# Patient Record
Sex: Male | Born: 1975 | Race: Black or African American | Hispanic: No | Marital: Single | Smoking: Current every day smoker
Health system: Southern US, Community
[De-identification: ages and names within clinical notes are randomized; demographics above are authoritative.]

## PROBLEM LIST (undated history)

## (undated) DIAGNOSIS — M109 Gout, unspecified: Secondary | ICD-10-CM

---

## 1998-09-01 ENCOUNTER — Emergency Department (HOSPITAL_COMMUNITY): Admission: EM | Admit: 1998-09-01 | Discharge: 1998-09-01 | Payer: Self-pay | Admitting: Emergency Medicine

## 1999-02-24 ENCOUNTER — Emergency Department (HOSPITAL_COMMUNITY): Admission: EM | Admit: 1999-02-24 | Discharge: 1999-02-24 | Payer: Self-pay | Admitting: Emergency Medicine

## 1999-02-24 ENCOUNTER — Encounter: Payer: Self-pay | Admitting: *Deleted

## 1999-05-01 ENCOUNTER — Emergency Department (HOSPITAL_COMMUNITY): Admission: EM | Admit: 1999-05-01 | Discharge: 1999-05-01 | Payer: Self-pay | Admitting: Emergency Medicine

## 1999-05-08 ENCOUNTER — Encounter: Payer: Self-pay | Admitting: Emergency Medicine

## 1999-05-08 ENCOUNTER — Emergency Department (HOSPITAL_COMMUNITY): Admission: EM | Admit: 1999-05-08 | Discharge: 1999-05-08 | Payer: Self-pay | Admitting: Emergency Medicine

## 1999-09-11 ENCOUNTER — Emergency Department (HOSPITAL_COMMUNITY): Admission: EM | Admit: 1999-09-11 | Discharge: 1999-09-11 | Payer: Self-pay | Admitting: Emergency Medicine

## 2002-12-31 ENCOUNTER — Emergency Department (HOSPITAL_COMMUNITY): Admission: EM | Admit: 2002-12-31 | Discharge: 2002-12-31 | Payer: Self-pay | Admitting: Emergency Medicine

## 2002-12-31 ENCOUNTER — Encounter: Payer: Self-pay | Admitting: Emergency Medicine

## 2003-02-20 ENCOUNTER — Emergency Department (HOSPITAL_COMMUNITY): Admission: EM | Admit: 2003-02-20 | Discharge: 2003-02-20 | Payer: Self-pay | Admitting: Emergency Medicine

## 2003-10-04 ENCOUNTER — Emergency Department (HOSPITAL_COMMUNITY): Admission: EM | Admit: 2003-10-04 | Discharge: 2003-10-04 | Payer: Self-pay | Admitting: Emergency Medicine

## 2004-11-03 ENCOUNTER — Emergency Department (HOSPITAL_COMMUNITY): Admission: EM | Admit: 2004-11-03 | Discharge: 2004-11-03 | Payer: Self-pay | Admitting: Emergency Medicine

## 2006-05-05 ENCOUNTER — Emergency Department (HOSPITAL_COMMUNITY): Admission: EM | Admit: 2006-05-05 | Discharge: 2006-05-05 | Payer: Self-pay | Admitting: Emergency Medicine

## 2007-02-07 ENCOUNTER — Emergency Department (HOSPITAL_COMMUNITY): Admission: EM | Admit: 2007-02-07 | Discharge: 2007-02-07 | Payer: Self-pay | Admitting: Family Medicine

## 2008-04-17 ENCOUNTER — Emergency Department (HOSPITAL_COMMUNITY): Admission: EM | Admit: 2008-04-17 | Discharge: 2008-04-17 | Payer: Self-pay | Admitting: Family Medicine

## 2008-06-20 ENCOUNTER — Emergency Department (HOSPITAL_COMMUNITY): Admission: EM | Admit: 2008-06-20 | Discharge: 2008-06-20 | Payer: Self-pay | Admitting: Emergency Medicine

## 2009-10-31 IMAGING — CR DG ABDOMEN 1V
2 series · 2 of 2 positions shown · non-contrast
Comparison: None

CLINICAL DATA: Bilateral intermittent flank pain.  Back pain.

ABDOMEN - 2 VIEW

[view not recorded (1 of 2)]
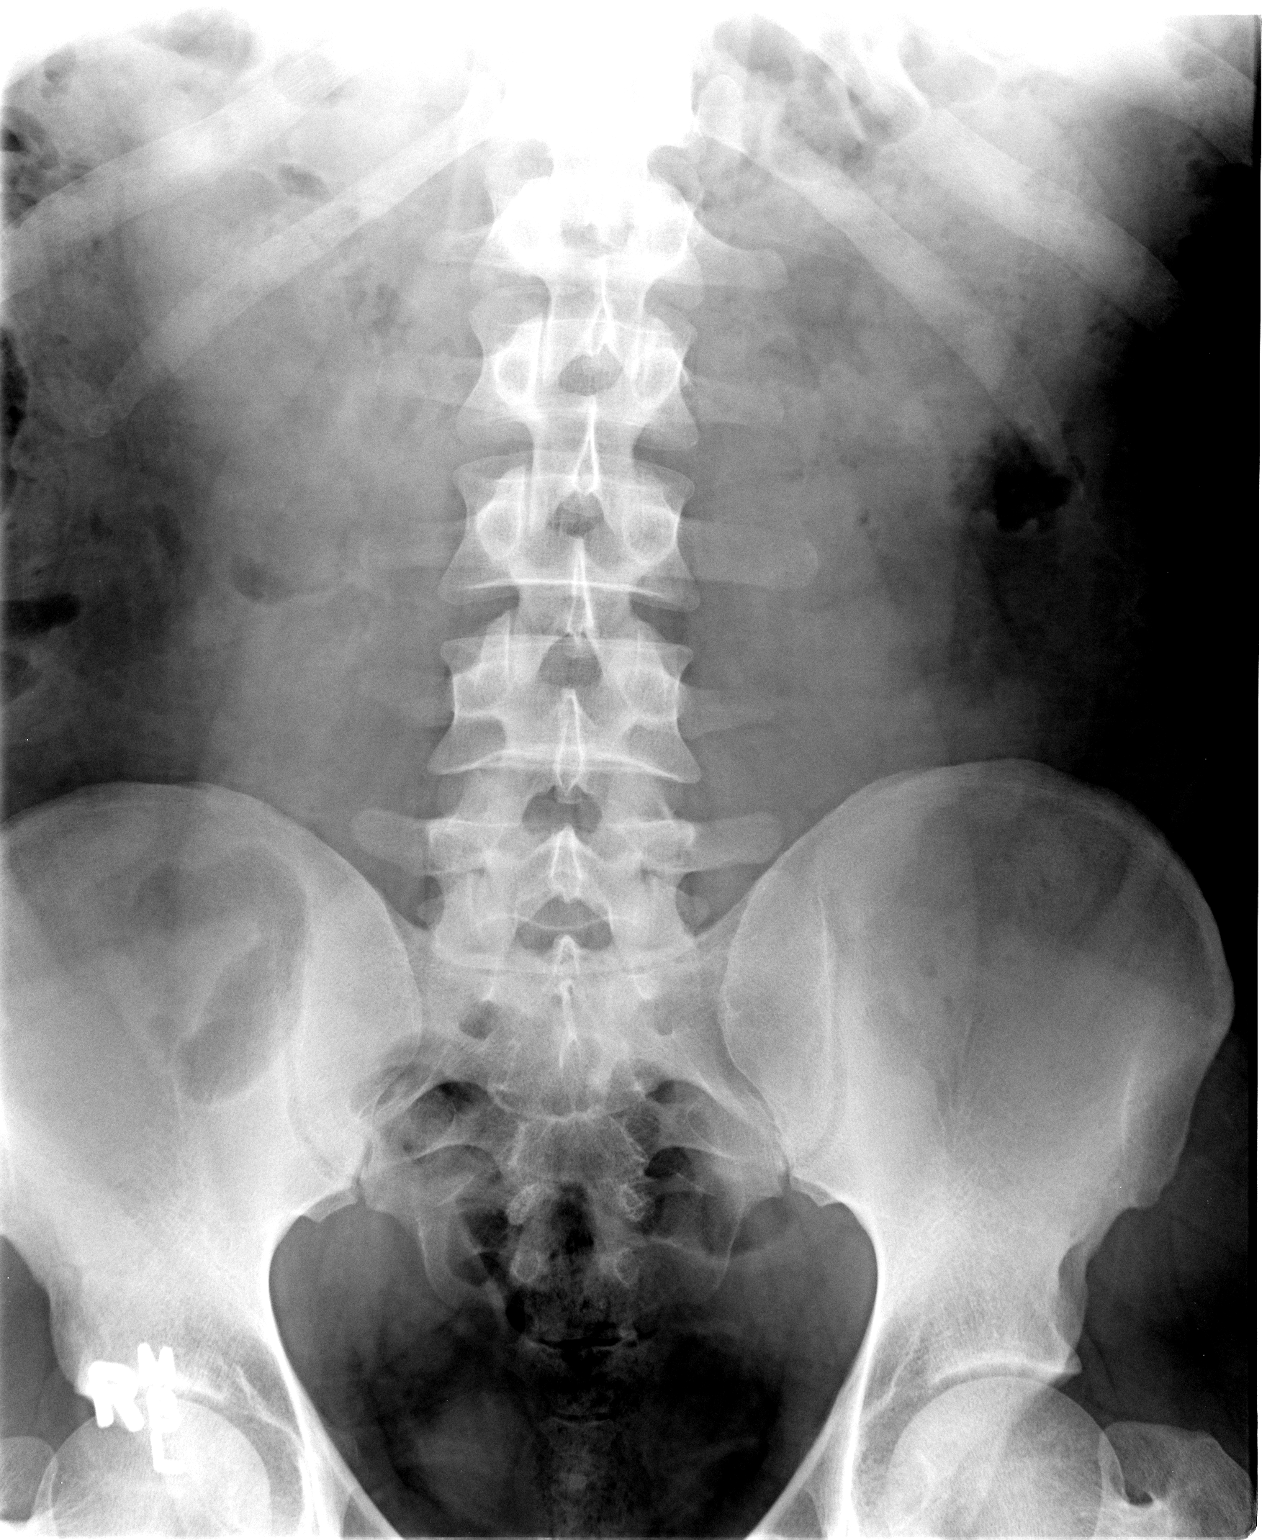

[view not recorded (2 of 2)]
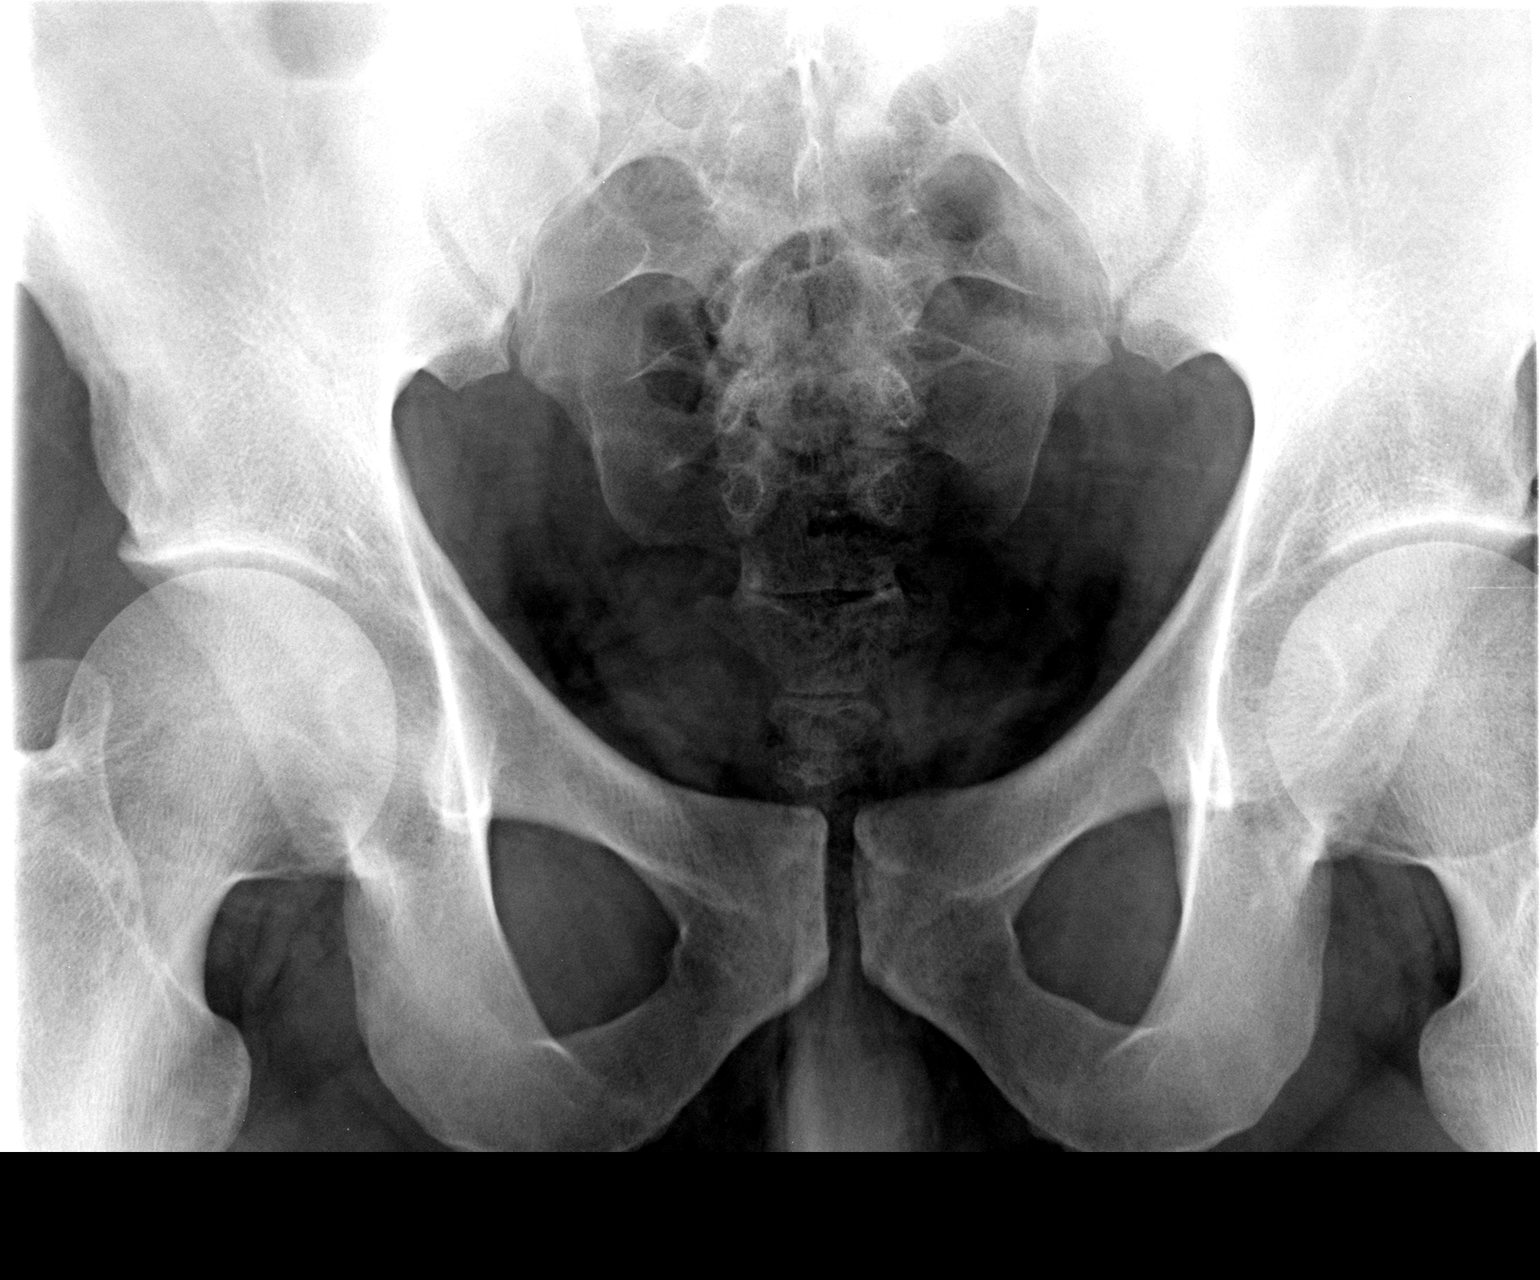

[2 of 2 positions shown; findings below may reference images not displayed]

FINDINGS: Normal bowel gas pattern.  No unusual calcifications.
Bony structures intact.  Psoas muscle margins are defined.
IMPRESSION: Negative abdomen for acute disease.

## 2011-05-18 LAB — POCT URINALYSIS DIP (DEVICE)
Glucose, UA: NEGATIVE mg/dL
Hgb urine dipstick: NEGATIVE
Nitrite: NEGATIVE
Operator id: 239701
Protein, ur: 30 mg/dL — AB
Specific Gravity, Urine: 1.025 (ref 1.005–1.030)
Urobilinogen, UA: 2 mg/dL — ABNORMAL HIGH (ref 0.0–1.0)
pH: 6 (ref 5.0–8.0)

## 2011-06-02 LAB — POCT RAPID STREP A: Streptococcus, Group A Screen (Direct): NEGATIVE

## 2015-12-06 ENCOUNTER — Encounter (HOSPITAL_COMMUNITY): Payer: Self-pay | Admitting: *Deleted

## 2015-12-06 ENCOUNTER — Emergency Department (HOSPITAL_COMMUNITY)
Admission: EM | Admit: 2015-12-06 | Discharge: 2015-12-06 | Disposition: A | Discharge status: Home / Self Care | Payer: Self-pay | Attending: Emergency Medicine | Admitting: Emergency Medicine

## 2015-12-06 DIAGNOSIS — M545 Low back pain, unspecified: Secondary | ICD-10-CM

## 2015-12-06 DIAGNOSIS — T148XXA Other injury of unspecified body region, initial encounter: Secondary | ICD-10-CM

## 2015-12-06 DIAGNOSIS — F17210 Nicotine dependence, cigarettes, uncomplicated: Secondary | ICD-10-CM | POA: Insufficient documentation

## 2015-12-06 DIAGNOSIS — Y9289 Other specified places as the place of occurrence of the external cause: Secondary | ICD-10-CM | POA: Insufficient documentation

## 2015-12-06 DIAGNOSIS — Y9389 Activity, other specified: Secondary | ICD-10-CM | POA: Insufficient documentation

## 2015-12-06 DIAGNOSIS — X509XXA Other and unspecified overexertion or strenuous movements or postures, initial encounter: Secondary | ICD-10-CM | POA: Insufficient documentation

## 2015-12-06 DIAGNOSIS — S29012A Strain of muscle and tendon of back wall of thorax, initial encounter: Secondary | ICD-10-CM | POA: Insufficient documentation

## 2015-12-06 DIAGNOSIS — Y990 Civilian activity done for income or pay: Secondary | ICD-10-CM | POA: Insufficient documentation

## 2015-12-06 MED ORDER — METHOCARBAMOL 500 MG PO TABS: 500.0000 mg | ORAL_TABLET | Freq: Two times a day (BID) | ORAL | Status: DC | PRN

## 2015-12-06 MED ORDER — IBUPROFEN 800 MG PO TABS: 800.0000 mg | ORAL_TABLET | Freq: Three times a day (TID) | ORAL | Status: DC

## 2015-12-06 NOTE — ED Notes (Signed)
PT reports muscle pain to Rt upper body and mid back.

## 2015-12-06 NOTE — ED Notes (Signed)
Declined W/C at D/C and was escorted to lobby by RN. 

## 2015-12-06 NOTE — ED Provider Notes (Signed)
CSN: [id number]     Arrival date & time 12/06/15  1003 History  By signing my name below, I, Placido SouLogan Joldersma, attest that this documentation has been prepared under the direction and in the presence of [facility] Pilcher Hays Dunnigan, PA-C. Electronically Signed: Placido SouLogan Joldersma, ED Scribe. 12/06/2015. 10:22 AM.   Chief Complaint  Patient presents with  . Muscle Pain   The history is provided by the patient. No language interpreter was used.   HPI Comments: Charles Price is a 40 y.o. male who presents to the Emergency Department complaining of waxing and waning, 5/10, right upper and bilateral mid-back pain x 1 week. 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 Pt denies taking any medications for pain management but has been resting after work with little relief. He denies neck pain, numbness, tingling, fever, b/b incontinence or other associated symptoms at this time.   History reviewed. No pertinent past medical history. History reviewed. No pertinent past surgical history. History reviewed. No pertinent family history. Social History  Substance Use Topics  . Smoking status: Current Every Day Smoker    Types: Cigarettes  . Smokeless tobacco: Never Used  . Alcohol Use: Yes     Comment: social    Review of Systems  Constitutional: Negative for fever.  Musculoskeletal: Positive for myalgias and back pain. Negative for neck pain.  Skin: Negative for wound.  Neurological: Negative for numbness.    Allergies  Review of patient's allergies indicates no known allergies.  Home Medications   Prior to Admission medications   Medication Sig Start Date End Date Taking? Authorizing Provider  ibuprofen (ADVIL,MOTRIN) 800 MG tablet Take 1 tablet (800 mg total) by mouth 3 (three) times daily. 12/06/15   Chase PicketJaime Pilcher Talecia Sherlin, PA-C  methocarbamol (ROBAXIN) 500 MG tablet Take 1 tablet (500 mg total) by mouth 2 (two) times daily as needed  for muscle spasms. 12/06/15   Chyenne Sobczak Pilcher Lyllian Gause, PA-C   BP 131/91 mmHg  Pulse 75  Temp(Src) 98.5 F (36.9 C) (Oral)  Resp 18  Ht 6\' 2"  (1.88 m)  Wt 103.42 kg  BMI 29.26 kg/m2  SpO2 99%    Physical Exam  Constitutional: He is oriented to person, place, and time. He appears well-developed and well-nourished.  NAD  HENT:  Head: Normocephalic and atraumatic.  Eyes: EOM are normal.  Neck: Normal range of motion.  Full ROM without pain No midline tenderness No tenderness of paraspinal musculature  Cardiovascular: Normal rate, regular rhythm and normal heart sounds.  Exam reveals no gallop and no friction rub.   No murmur heard. Pulmonary/Chest: Effort normal and breath sounds normal. No respiratory distress. He has no wheezes. He has no rales.  Abdominal: Soft. Bowel sounds are normal. He exhibits no distension. There is no tenderness.  Musculoskeletal: Normal range of motion.  Gait is not antalgic; patient is able to ambulate without difficulty. No noted deformities or signs of inflammation. No overlying skin changes. No midline tenderness; mild tenderness to palpation of lumbar paraspinal musculature. Full ROM. Straight leg raises are negative bilaterally for radicular symptoms. 5/5 muscle strength of bilateral LE's. Right shoulder with full ROM. Negative Neer's, negative Hawkins.   Neurological: He is alert and oriented to person, place, and time. He has normal reflexes.  All four extremities neurovascularly intact.  Skin: Skin is warm and dry. No rash noted. No erythema.  Psychiatric: He has a normal mood and affect.  Nursing note  and vitals reviewed.   ED Course  Procedures  COORDINATION OF CARE: 10:20 AM Discussed next steps with pt. He verbalized understanding and is agreeable with the plan.   Labs Review Labs Reviewed - No data to display  Imaging Review No results found.   EKG Interpretation None      MDM   Final diagnoses:  Muscle strain  Bilateral low back  pain without sciatica   Charles Price presents to ED for bilateral low back pain and right sided upper back pain. Right upper back pain is in the rhomboid musculature, nontender to palpation, worse with movement. LBP bilateral, tender to palpation of paraspinal musculature. No neurological deficits and normal neuro exam. Patient is ambulatory. No loss of bowel or bladder control. No concern for cauda equina. No fever, night sweats, weight loss, h/o cancer, IVDA, no recent procedure to back. No urinary symptoms suggestive of UTI. Supportive care and return precaution discussed. Rx for ibuprofen, robaxin. Appears safe for discharge at this time. Follow up as indicated in discharge paperwork.   I personally performed the services described in this documentation, which was scribed in my presence. The recorded information has been reviewed and is accurate.  [facility] Elnoria Livingston, PA-C 12/06/15 1203  Cathren Laine, MD 12/07/15 [phone]

## 2015-12-06 NOTE — Discharge Instructions (Signed)
Take anti-inflammatory (ibuprofen) 3 times daily for the first 2 days, then only as needed for pain. Use Robaxin (your muscle relaxer) only as needed, best at bedtime - This can make you very drowsy - please do not drink or drive on this medication. If symptoms do not improve over the next week, please follow-up with the orthopedic clinic listed or your primary provider. Return to the ER for any new or worsening symptoms, any additional concerns.

## 2016-02-27 ENCOUNTER — Emergency Department (HOSPITAL_COMMUNITY)
Admission: EM | Admit: 2016-02-27 | Discharge: 2016-02-27 | Disposition: A | Discharge status: Home / Self Care | Payer: Self-pay | Attending: Emergency Medicine | Admitting: Emergency Medicine

## 2016-02-27 ENCOUNTER — Encounter (HOSPITAL_COMMUNITY): Payer: Self-pay | Admitting: Emergency Medicine

## 2016-02-27 DIAGNOSIS — F17210 Nicotine dependence, cigarettes, uncomplicated: Secondary | ICD-10-CM | POA: Insufficient documentation

## 2016-02-27 DIAGNOSIS — Y929 Unspecified place or not applicable: Secondary | ICD-10-CM | POA: Insufficient documentation

## 2016-02-27 DIAGNOSIS — Z79899 Other long term (current) drug therapy: Secondary | ICD-10-CM | POA: Insufficient documentation

## 2016-02-27 DIAGNOSIS — Y9389 Activity, other specified: Secondary | ICD-10-CM | POA: Insufficient documentation

## 2016-02-27 DIAGNOSIS — S29012A Strain of muscle and tendon of back wall of thorax, initial encounter: Secondary | ICD-10-CM | POA: Insufficient documentation

## 2016-02-27 DIAGNOSIS — Y999 Unspecified external cause status: Secondary | ICD-10-CM | POA: Insufficient documentation

## 2016-02-27 DIAGNOSIS — X500XXA Overexertion from strenuous movement or load, initial encounter: Secondary | ICD-10-CM | POA: Insufficient documentation

## 2016-02-27 DIAGNOSIS — T148XXA Other injury of unspecified body region, initial encounter: Secondary | ICD-10-CM

## 2016-02-27 MED ORDER — METHOCARBAMOL 500 MG PO TABS: 500.0000 mg | ORAL_TABLET | Freq: Every evening | ORAL | Status: DC | PRN

## 2016-02-27 MED ORDER — LIDOCAINE 5 % EX PTCH
1.0000 | MEDICATED_PATCH | Freq: Once | CUTANEOUS | Status: DC
  Administered 2016-02-27: 1 via TRANSDERMAL
  Filled 2016-02-27: qty 1

## 2016-02-27 MED ORDER — IBUPROFEN 400 MG PO TABS
800.0000 mg | ORAL_TABLET | Freq: Once | ORAL | Status: AC
  Administered 2016-02-27: 800 mg via ORAL
  Filled 2016-02-27: qty 2

## 2016-02-27 MED ORDER — IBUPROFEN 800 MG PO TABS
800.0000 mg | ORAL_TABLET | Freq: Three times a day (TID) | ORAL | Status: DC
Start: 2016-02-27 — End: 2019-01-21

## 2016-02-27 NOTE — ED Notes (Signed)
Pt c/o mid back pain with spasms x 1 week; pt sts hx of same in past

## 2016-02-27 NOTE — ED Provider Notes (Signed)
CSN: [id number]     Arrival date & time 02/27/16  1447 History  By signing my name below, I, Phillis Haggis, attest that this documentation has been prepared under the direction and in the presence of [facility] PA-C. Electronically Signed: Phillis Haggis, ED Scribe. 02/27/2016. 3:28 PM.   Chief Complaint  Patient presents with  . Back Pain   The history is provided by the patient. No language interpreter was used.  HPI Comments: ERAN MISTRY is a 40 y.o. male who presents to the Emergency Department complaining of constant, nagging, right middle back pain onset one week ago. He reports associated spasms to the area. He reports history of similar pain in the past that was relieved with 800 mg ibuprofen. 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 He has tried a back brace and 2 BCs for the pain to no relief. He denies new injury or fall, gait abnormality, rash, wound, numbness, weakness, bladder or bowel incontinence.   History reviewed. No pertinent past medical history. History reviewed. No pertinent past surgical history. History reviewed. No pertinent family history. Social History  Substance Use Topics  . Smoking status: Current Every Day Smoker    Types: Cigarettes  . Smokeless tobacco: Never Used  . Alcohol Use: Yes     Comment: social    Review of Systems  Musculoskeletal: Positive for back pain. Negative for gait problem.  Skin: Negative for rash and wound.  Neurological: Negative for weakness and numbness.    Allergies  Review of patient's allergies indicates no known allergies.  Home Medications   Prior to Admission medications   Medication Sig Start Date End Date Taking? Authorizing Provider  ibuprofen (ADVIL,MOTRIN) 800 MG tablet Take 1 tablet (800 mg total) by mouth 3 (three) times daily. 02/27/16   Chase Picket Tocara Mennen, PA-C  methocarbamol (ROBAXIN) 500 MG tablet Take 1 tablet (500 mg total) by mouth at bedtime as needed for  muscle spasms. 02/27/16   Tremell Reimers Pilcher Brenon Antosh, PA-C   BP 130/84 mmHg  Pulse 54  Temp(Src) 98.1 F (36.7 C) (Oral)  Resp 16  SpO2 100% Physical Exam  Constitutional: He is oriented to person, place, and time. He appears well-developed and well-nourished.  HENT:  Head: Normocephalic and atraumatic.  Neck: Normal range of motion. Neck supple.  Cardiovascular: Normal rate, regular rhythm and normal heart sounds.   Pulmonary/Chest: Effort normal and breath sounds normal. No respiratory distress. He has no rales.  Abdominal: Soft. He exhibits no distension. There is no tenderness.  Musculoskeletal: Normal range of motion. He exhibits no edema.  No midline spine tenderness; No C spine, T or L spine tenderness; right thoracic paraspinal tenderness. Full ROM.   Neurological: He is alert and oriented to person, place, and time. He has normal reflexes. Coordination normal.  Bilateral lower extremities neurovascularly intact.   Skin: Skin is warm and dry.  Nursing note and vitals reviewed.   ED Course  Procedures (including critical care time) DIAGNOSTIC STUDIES: Oxygen Saturation is 100% on RA, normal by my interpretation.    COORDINATION OF CARE: 3:25 PM-Discussed treatment plan which includes lidocaine patches, ibuprofen and muscle relaxants with pt at bedside and pt agreed to plan.    Labs Review Labs Reviewed - No data to display  Imaging Review No results found. I have personally reviewed and evaluated these images and lab results as part of my medical decision-making.   EKG Interpretation None  MDM   Final diagnoses:  Muscle strain   Giorgi K Mahon presents with back paiCorky Craftsn. Patient demonstrates no lower extremity weakness, saddle anesthesia, bowel or bladder incontinence, or neuro deficits. No concern for cauda equina. No fevers or other infectious symptoms to suggest that the patient's back pain is due to an infection. Lower extremities are neurovascularly intact and  patient is ambulating without difficulty. Lidocaine patch and ibuprofen given in ED with moderate relief of symptoms. Patient states ibuprofen and Robaxin worked very well for him in the past. He will give Rx for these medications. I have reviewed return precautions, including the development of any of these signs or symptoms, and the patient has voiced understanding. I reviewed supportive care instructions and PCP follow-up if symptoms do not improve for referral to physical therapy. Patient voiced understanding and agreement with plan.   I personally performed the services described in this documentation, which was scribed in my presence. The recorded information has been reviewed and is accurate.  [facility] Pilcher Annabelle Rexroad, PA-C 02/27/16 1612  Derwood KaplanAnkit Nanavati, MD 02/29/16 1907

## 2016-02-27 NOTE — Discharge Instructions (Signed)
Ibuprofen as needed for pain.   Back Pain:  Your back pain should be treated with medicines such as ibuprofen or aleve and this back pain should get better over the next 2 weeks.  However if you develop severe or worsening pain, low back pain with fever, numbness, weakness or inability to walk or urinate, you should return to the ER immediately.  Please follow up with your doctor this week for a recheck if still having symptoms.  Low back pain is discomfort in the lower back that may be due to injuries to muscles and ligaments around the spine. Occasionally, it may be caused by a a problem to a part of the spine called a disc. The pain may last several days or a week;  However, most patients get completely well in 4 weeks.  COLD THERAPY DIRECTIONS:  Ice or gel packs can be used to reduce both pain and swelling. Ice is the most helpful within the first 24 to 48 hours after an injury or flareup from overusing a muscle or joint.  Ice is effective, has very few side effects, and is safe for most people to use.   If you expose your skin to cold temperatures for too long or without the proper protection, you can damage your skin or nerves. Watch for signs of skin damage due to cold.   HOME CARE INSTRUCTIONS  Follow these tips to use ice and cold packs safely.  Place a dry or damp towel between the ice and skin. A damp towel will cool the skin more quickly, so you may need to shorten the time that the ice is used.  For a more rapid response, add gentle compression to the ice.  Ice for no more than 10 to 20 minutes at a time. The bonier the area you are icing, the less time it will take to get the benefits of ice.  Check your skin after 5 minutes to make sure there are no signs of a poor response to cold or skin damage.  Rest 20 minutes or more in between uses.  Once your skin is numb, you can end your treatment. You can test numbness by very lightly touching your skin. The touch should be so light that  you do not see the skin dimple from the pressure of your fingertip. When using ice, most people will feel these normal sensations in this order: cold, burning, aching, and numbness.   You will need to follow up with  Your primary healthcare provider in 1-2 weeks for reassessment.  Be aware that if you develop new symptoms, such as a fever, leg weakness, difficulty with or loss of control of your urine or bowels, abdominal pain, or more severe pain, you will need to seek medical attention and  / or return to the Emergency department.

## 2017-06-12 ENCOUNTER — Encounter (HOSPITAL_COMMUNITY): Payer: Self-pay | Admitting: Emergency Medicine

## 2017-06-12 ENCOUNTER — Emergency Department (HOSPITAL_COMMUNITY)
Admission: EM | Admit: 2017-06-12 | Discharge: 2017-06-12 | Disposition: A | Discharge status: Home / Self Care | Payer: Self-pay | Attending: Emergency Medicine | Admitting: Emergency Medicine

## 2017-06-12 DIAGNOSIS — Z79899 Other long term (current) drug therapy: Secondary | ICD-10-CM | POA: Insufficient documentation

## 2017-06-12 DIAGNOSIS — F17210 Nicotine dependence, cigarettes, uncomplicated: Secondary | ICD-10-CM | POA: Insufficient documentation

## 2017-06-12 DIAGNOSIS — M546 Pain in thoracic spine: Secondary | ICD-10-CM | POA: Insufficient documentation

## 2017-06-12 MED ORDER — LIDOCAINE 5 % EX PTCH: 1.0000 | MEDICATED_PATCH | CUTANEOUS | 0 refills | Status: DC

## 2017-06-12 MED ORDER — NAPROXEN 500 MG PO TABS: 500.0000 mg | ORAL_TABLET | Freq: Two times a day (BID) | ORAL | 0 refills | Status: DC

## 2017-06-12 MED ORDER — METHOCARBAMOL 500 MG PO TABS: 500.0000 mg | ORAL_TABLET | Freq: Two times a day (BID) | ORAL | 0 refills | Status: DC

## 2017-06-12 NOTE — ED Triage Notes (Signed)
Patient reports that he had previous back injury and he does a lot of lifting at work. Noticed over the past 2 days had left mid back pain with bending over, lifting, twisting.

## 2017-06-12 NOTE — Discharge Instructions (Signed)
Expect your soreness to increase over the next 2-3 days. Take it easy, but do not lay around too much as this may make any stiffness worse.  °Antiinflammatory medications: Take 600 mg of ibuprofen every 6 hours or 440 mg (over the counter dose) to 500 mg (prescription dose) of naproxen every 12 hours for the next 3 days. After this time, these medications may be used as needed for pain. Take these medications with food to avoid upset stomach. Choose only one of these medications, do not take them together.  °Tylenol: Should you continue to have additional pain while taking the ibuprofen or naproxen, you may add in tylenol as needed. Your daily total maximum amount of tylenol from all sources should be limited to 4000mg/day for persons without liver problems, or 2000mg/day for those with liver problems. °Muscle relaxer: Robaxin is a muscle relaxer and may help loosen stiff muscles. Do not take the Robaxin while driving or performing other dangerous activities.  °Lidocaine patches: These are available via either prescription or over-the-counter. The over-the-counter option may be more economical one and are likely just as effective. There are multiple over-the-counter brands, such as Salonpas. °Exercises: Be sure to perform the attached exercises starting with three times a week and working up to performing them daily. This is an essential part of preventing long term problems.  ° °Follow up with a primary care provider for any future management of these complaints. °

## 2017-06-12 NOTE — ED Provider Notes (Signed)
[facility]-EMERGENCY DEPT Provider Note   CSN: [id number] Arrival date & time: 06/12/17  4098     History   Chief Complaint Chief Complaint  Patient presents with  . Back Pain    HPI Charles Price is a 41 y.o. male.  HPI   Charles Price is a 41 y.o. male, patient with no pertinent past medical history, presenting to the ED with left mid back pain beginning yesterday.  Patient states the pain began during work yesterday.  Patient lifts boxes for living.  Describes the pain as a tightness, moderate, nonradiating.  He has not tried any therapies for the pain.  Denies weakness, numbness, falls, urinary complaints, fever, nausea/vomiting, or any other complaints.       History reviewed. No pertinent past medical history.  There are no active problems to display for this patient.   History reviewed. No pertinent surgical history.     Home Medications    Prior to Admission medications   Medication Sig Start Date End Date Taking? Authorizing Provider  ibuprofen (ADVIL,MOTRIN) 800 MG tablet Take 1 tablet (800 mg total) by mouth 3 (three) times daily. 02/27/16   Ward, Chase Picket, PA-C  lidocaine (LIDODERM) 5 % Place 1 patch onto the skin daily. Remove & Discard patch within 12 hours or as directed by MD 06/12/17   Usman Millett C, PA-C  methocarbamol (ROBAXIN) 500 MG tablet Take 1 tablet (500 mg total) by mouth 2 (two) times daily. 06/12/17   Ethelene Closser C, PA-C  naproxen (NAPROSYN) 500 MG tablet Take 1 tablet (500 mg total) by mouth 2 (two) times daily. 06/12/17   Jhalil Silvera, Hillard Danker, PA-C    Family History No family history on file.  Social History Social History  Substance Use Topics  . Smoking status: Current Every Day Smoker    Types: Cigarettes  . Smokeless tobacco: Never Used  . Alcohol use Yes     Comment: social     Allergies   Patient has no known allergies.   Review of Systems Review of Systems  Constitutional: Negative for fever.    Gastrointestinal: Negative for abdominal pain, nausea and vomiting.  Genitourinary: Negative for dysuria, flank pain, frequency and hematuria.  Musculoskeletal: Positive for back pain.  Neurological: Negative for weakness and numbness.     Physical Exam Updated Vital Signs BP (!) 144/89 (BP Location: Right Arm)   Pulse 64   Temp 98.2 F (36.8 C) (Oral)   Resp 18   Ht 6\' 2"  (1.88 m)   Wt 113.4 kg (250 lb)   SpO2 98%   BMI 32.10 kg/m   Physical Exam  Constitutional: He appears well-developed and well-nourished. No distress.  HENT:  Head: Normocephalic and atraumatic.  Eyes: Conjunctivae are normal.  Neck: Neck supple.  Cardiovascular: Normal rate, regular rhythm and intact distal pulses.   Pulmonary/Chest: Effort normal.  Musculoskeletal: He exhibits tenderness. He exhibits no edema.  Tenderness to the left thoracic musculature without noted swelling, masses, erythema, or ecchymosis. Normal motor function intact in all extremities and spine. No midline spinal tenderness.   Neurological: He is alert.  No noted acute sensory deficits. Strength 5/5 with flexion and extension at the bilateral hips, knees, and ankles. No noted gait deficit. Coordination intact with heel to shin testing.  Skin: Skin is warm and dry. He is not diaphoretic. No pallor.  Psychiatric: He has a normal mood and affect. His behavior is normal.  Nursing note and vitals reviewed.  ED Treatments / Results  Labs (all labs ordered are listed, but only abnormal results are displayed) Labs Reviewed - No data to display  EKG  EKG Interpretation None       Radiology No results found.  Procedures Procedures (including critical care time)  Medications Ordered in ED Medications - No data to display   Initial Impression / Assessment and Plan / ED Course  I have reviewed the triage vital signs and the nursing notes.  Pertinent labs & imaging results that were available during my care of the  patient were reviewed by me and considered in my medical decision making (see chart for details).     Patient presents with mid back pain.  Low suspicion for renal pathology.  Symptoms and physical exam findings give suspicion for muscular strain.  PCP follow-up as needed.  Resources given. The patient was given instructions for home care as well as return precautions. Patient voices understanding of these instructions, accepts the plan, and is comfortable with discharge.     Final Clinical Impressions(s) / ED Diagnoses   Final diagnoses:  Acute left-sided thoracic back pain    New Prescriptions New Prescriptions   LIDOCAINE (LIDODERM) 5 %    Place 1 patch onto the skin daily. Remove & Discard patch within 12 hours or as directed by MD   METHOCARBAMOL (ROBAXIN) 500 MG TABLET    Take 1 tablet (500 mg total) by mouth 2 (two) times daily.   NAPROXEN (NAPROSYN) 500 MG TABLET    Take 1 tablet (500 mg total) by mouth 2 (two) times daily.     Anselm PancoastJoy, Terrian Ridlon C, PA-C 06/12/17 [id number]    Lorre NickAllen, Anthony, MD 06/12/17 1500

## 2019-01-21 ENCOUNTER — Other Ambulatory Visit: Payer: Self-pay

## 2019-01-21 ENCOUNTER — Emergency Department (HOSPITAL_COMMUNITY): Payer: BC Managed Care – PPO

## 2019-01-21 ENCOUNTER — Emergency Department (HOSPITAL_COMMUNITY)
Admission: EM | Admit: 2019-01-21 | Discharge: 2019-01-21 | Disposition: A | Discharge status: Home / Self Care | Payer: BC Managed Care – PPO | Attending: Emergency Medicine | Admitting: Emergency Medicine

## 2019-01-21 ENCOUNTER — Encounter (HOSPITAL_COMMUNITY): Payer: Self-pay | Admitting: Emergency Medicine

## 2019-01-21 DIAGNOSIS — F17210 Nicotine dependence, cigarettes, uncomplicated: Secondary | ICD-10-CM | POA: Diagnosis not present

## 2019-01-21 DIAGNOSIS — M25561 Pain in right knee: Secondary | ICD-10-CM | POA: Insufficient documentation

## 2019-01-21 MED ORDER — NAPROXEN 500 MG PO TABS: 500.0000 mg | ORAL_TABLET | Freq: Two times a day (BID) | ORAL | 0 refills | Status: AC

## 2019-01-21 MED ORDER — NAPROXEN 250 MG PO TABS
500.0000 mg | ORAL_TABLET | Freq: Once | ORAL | Status: AC
  Administered 2019-01-21: 500 mg via ORAL
  Filled 2019-01-21: qty 2

## 2019-01-21 NOTE — ED Triage Notes (Signed)
At work last night, mis stepped   wrenched his R knee  Increased pain and difficulty walking on it  Pain unrelieved by tylenol

## 2019-01-21 NOTE — Discharge Instructions (Addendum)
Take naproxen 2 times a day with meals.  Do not take other anti-inflammatories at the same time (Advil, Motrin, ibuprofen, Aleve). You may supplement with Tylenol if you need further pain control. Use ice packs  for pain and swelling.  Keep your leg elevated when able to decrease swelling.  Use the knee immobilizer when walking for pain control. Use the crutches as needed.  If you pain is not improving in the next week, follow up with the orthopedic doctor listed below.  Return to the ER if you develop numbness, severe worsening pain, or any new, worsening, or concerning symptoms.

## 2019-01-21 NOTE — ED Provider Notes (Signed)
[facility]GENCY DEPARTMENT Provider Note   CSN: [id number] Arrival date & time: 01/21/19  1127    History   Chief Complaint Chief Complaint  Patient presents with  . Leg Pain    HPI Charles Price is a 43 y.o. male presenting for evaluation of right knee pain.  Patient states last night around 9:00 he tripped over a bar, causing him to twist his right knee.  He reports acute onset pain, which is gradually worsened.  He took 2 Tylenol last night without significant improvement of symptoms.  He denies numbness or tingling.  He denies injury elsewhere.  He denies radiation of the pain.  Pain is mostly the anterior aspect of the knee.  He denies previous injury to this knee. Pain is worse with ambulation and palpation, nothing makes it better.      HPI  History reviewed. No pertinent past medical history.  There are no active problems to display for this patient.   History reviewed. No pertinent surgical history.      Home Medications    Prior to Admission medications   Medication Sig Start Date End Date Taking? Authorizing Provider  naproxen (NAPROSYN) 500 MG tablet Take 1 tablet (500 mg total) by mouth 2 (two) times daily with a meal for 21 days. 01/21/19 02/11/19  Kore Madlock, PA-C    Family History No family history on file.  Social History Social History   Tobacco Use  . Smoking status: Current Every Day Smoker    Packs/day: 0.50    Types: Cigarettes  . Smokeless tobacco: Never Used  Substance Use Topics  . Alcohol use: Yes    Comment: social  . Drug use: No     Allergies   Patient has no known allergies.   Review of Systems Review of Systems  Musculoskeletal: Positive for arthralgias and joint swelling.  Neurological: Negative for numbness.     Physical Exam Updated Vital Signs BP (!) 165/81 (BP Location: Left Arm)   Pulse 65   Temp 97.9 F (36.6 C) (Oral)   Resp 16   Ht 6\' 2"  (1.88 m)   Wt 102.1 kg   SpO2 99%   BMI 28.89 kg/m    Physical Exam Vitals signs and nursing note reviewed.  Constitutional:      General: He is not in acute distress.    Appearance: He is well-developed.  HENT:     Head: Normocephalic and atraumatic.  Neck:     Musculoskeletal: Normal range of motion.  Pulmonary:     Effort: Pulmonary effort is normal.  Abdominal:     General: There is no distension.  Musculoskeletal:        General: Swelling and tenderness present.     Comments: Mild swelling of the anterior knee surrounding the patella.  No erythema or warmth.  No tenderness palpation along the joint line or the posterior knee.  No tenderness palpation of the calf.  Pedal pulses intact bilaterally.  Good cap refill bilaterally.  Skin:    General: Skin is warm.     Capillary Refill: Capillary refill takes less than 2 seconds.     Findings: No rash.  Neurological:     Mental Status: He is alert and oriented to person, place, and time.      ED Treatments / Results  Labs (all labs ordered are listed, but only abnormal results are displayed) Labs Reviewed - No data to display  EKG None  Radiology Dg Knee Complete 4  Views Right  Result Date: 01/21/2019 CLINICAL DATA:  Right knee injury at work last night walking down steps. EXAM: RIGHT KNEE - COMPLETE 4+ VIEW COMPARISON:  None. FINDINGS: No evidence of fracture, dislocation, or joint effusion. No evidence of arthropathy or other focal bone abnormality. Soft tissues are unremarkable. IMPRESSION: Negative. Electronically Signed   By: Marin Olp M.D.   On: 01/21/2019 13:03    Procedures Procedures (including critical care time)  Medications Ordered in ED Medications  naproxen (NAPROSYN) tablet 500 mg (500 mg Oral Given 01/21/19 1347)     Initial Impression / Assessment and Plan / ED Course  I have reviewed the triage vital signs and the nursing notes.  Pertinent labs & imaging results that were available during my care of the patient were reviewed by me and considered  in my medical decision making (see chart for details).        Pt presenting for evaluation of right knee pain.  Physical exam reassuring, he is neurovascularly intact.  Will obtain x-rays for further evaluation.  X-rays viewed interpreted by me, no fracture or dislocation.  Likely MSK/cartilaginous/ligamentous injury.  Discussed findings with patient.  Discuss symptomatic treatment with crutches, knee immobilizer, NSAIDs, rest, ice, and elevation.  Encourage follow-up with orthopedics if symptoms not improving in the next week.  At this time, patient appears safe for discharge.  Return precautions given.  Patient states he understands and agrees to plan.   Final Clinical Impressions(s) / ED Diagnoses   Final diagnoses:  Acute pain of right knee    ED Discharge Orders         Ordered    naproxen (NAPROSYN) 500 MG tablet  2 times daily with meals     01/21/19 [place], Dewon Mendizabal, PA-C 01/21/19 [place], MD 01/25/19 [phone]

## 2019-07-24 ENCOUNTER — Other Ambulatory Visit: Payer: Self-pay

## 2019-07-24 DIAGNOSIS — Z20822 Contact with and (suspected) exposure to covid-19: Secondary | ICD-10-CM

## 2019-07-25 LAB — NOVEL CORONAVIRUS, NAA: SARS-CoV-2, NAA: NOT DETECTED

## 2019-07-30 ENCOUNTER — Telehealth: Payer: Self-pay | Admitting: *Deleted

## 2019-07-30 NOTE — Telephone Encounter (Signed)
Called in requesting COVID-19 test result.   I let him know it was not detected meaning he did not have the virus.  He thanked me for my help.

## 2020-05-12 ENCOUNTER — Emergency Department (HOSPITAL_COMMUNITY)
Admission: EM | Admit: 2020-05-12 | Discharge: 2020-05-12 | Disposition: A | Discharge status: Home / Self Care | Payer: BC Managed Care – PPO | Attending: Emergency Medicine | Admitting: Emergency Medicine

## 2020-05-12 ENCOUNTER — Encounter (HOSPITAL_COMMUNITY): Payer: Self-pay | Admitting: Emergency Medicine

## 2020-05-12 ENCOUNTER — Other Ambulatory Visit: Payer: Self-pay

## 2020-05-12 DIAGNOSIS — Z20822 Contact with and (suspected) exposure to covid-19: Secondary | ICD-10-CM | POA: Insufficient documentation

## 2020-05-12 DIAGNOSIS — F17210 Nicotine dependence, cigarettes, uncomplicated: Secondary | ICD-10-CM | POA: Insufficient documentation

## 2020-05-12 DIAGNOSIS — J069 Acute upper respiratory infection, unspecified: Secondary | ICD-10-CM | POA: Insufficient documentation

## 2020-05-12 LAB — RESPIRATORY PANEL BY RT PCR (FLU A&B, COVID)
Influenza A by PCR: NEGATIVE
Influenza B by PCR: NEGATIVE
SARS Coronavirus 2 by RT PCR: NEGATIVE

## 2020-05-12 LAB — GROUP A STREP BY PCR: Group A Strep by PCR: NOT DETECTED

## 2020-05-12 NOTE — ED Provider Notes (Signed)
[facility] EMERGENCY DEPARTMENT Provider Note   CSN: [id number] Arrival date & time: 05/12/20  1346     History Chief Complaint  Patient presents with   Sore Throat    Charles Price is a 44 y.o. male.  HPI   Patient with no significant medical history presents to the emergency department with chief complaint of nasal congestion, sore throat, cough that started on Friday.  Patient states after he took a shower he went outside still wet and believes that is when his symptoms first started.  He denies fevers, chills, chest pain, shortness of breath, nausea, vomiting, diarrhea.  He denies any recent sick contacts, is not Covid vaccinated, no recent travels.  Patient denies any alleviating or aggravating factors.  Patient denies headache, fever, chills, shortness of breath, chest pain, abdominal pain, nausea, vomiting, diarrhea, pedal edema.  History reviewed. No pertinent past medical history.  There are no problems to display for this patient.   History reviewed. No pertinent surgical history.     History reviewed. No pertinent family history.  Social History   Tobacco Use   Smoking status: Current Every Day Smoker    Packs/day: 0.50    Types: Cigarettes   Smokeless tobacco: Never Used  Vaping Use   Vaping Use: Never used  Substance Use Topics   Alcohol use: Yes    Comment: social   Drug use: No    Home Medications Prior to Admission medications   Not on File    Allergies    Patient has no known allergies.  Review of Systems   Review of Systems  Constitutional: Negative for chills and fever.  HENT: Positive for congestion and sore throat. Negative for tinnitus, trouble swallowing and voice change.   Eyes: Negative for visual disturbance.  Respiratory: Positive for cough. Negative for shortness of breath.   Cardiovascular: Negative for chest pain.  Gastrointestinal: Negative for abdominal pain, diarrhea, nausea and vomiting.  Genitourinary: Negative  for enuresis, flank pain, frequency and genital sores.  Musculoskeletal: Negative for back pain.  Skin: Negative for rash.  Neurological: Negative for dizziness and headaches.  Hematological: Does not bruise/bleed easily.    Physical Exam Updated Vital Signs BP (!) 126/93    Pulse 71    Temp 98.8 F (37.1 C) (Oral)    Resp 20    Ht 6\' 2"  (1.88 m)    Wt 108.9 kg    SpO2 99%    BMI 30.81 kg/m   Physical Exam Vitals and nursing note reviewed.  Constitutional:      General: He is not in acute distress.    Appearance: He is not ill-appearing.  HENT:     Head: Normocephalic and atraumatic.     Nose: No congestion.     Mouth/Throat:     Mouth: Mucous membranes are moist.     Pharynx: Oropharynx is clear. Posterior oropharyngeal erythema present. No oropharyngeal exudate.     Comments: Oropharynx is visualized, tongue uvula are both midline, controlling his own secretions out difficulty, no exudates noted on exam.  Eyes:     General: No scleral icterus. Cardiovascular:     Rate and Rhythm: Normal rate and regular rhythm.     Pulses: Normal pulses.     Heart sounds: No murmur heard.  No friction rub. No gallop.   Pulmonary:     Effort: No respiratory distress.     Breath sounds: No stridor. No wheezing, rhonchi or rales.  Abdominal:  General: There is no distension.     Tenderness: There is no abdominal tenderness. There is no right CVA tenderness, left CVA tenderness or guarding.  Musculoskeletal:        General: No swelling or tenderness.  Skin:    General: Skin is warm and dry.     Findings: No rash.  Neurological:     Mental Status: He is alert.  Psychiatric:        Mood and Affect: Mood normal.     ED Results / Procedures / Treatments   Labs (all labs ordered are listed, but only abnormal results are displayed) Labs Reviewed  RESPIRATORY PANEL BY RT PCR (FLU A&B, COVID)  GROUP A STREP BY PCR    EKG None  Radiology No results  found.  Procedures Procedures (including critical care time)  Medications Ordered in ED Medications - No data to display  ED Course  I have reviewed the triage vital signs and the nursing notes.  Pertinent labs & imaging results that were available during my care of the patient were reviewed by me and considered in my medical decision making (see chart for details).    MDM Rules/Calculators/A&P                          I have personally reviewed all imaging, labs and have interpreted them.  Presents with nasal congestion, sore throat, cough that started on Friday.  Patient was alert, did not appear in acute distress, vital signs reassuring.  Will order respiratory panel and strep test for further evaluation.  Respiratory panel was negative for Covid, influenza a/B.  Strep test was negative.  I have low suspicion patient suffering from a systemic infection as patient is nontoxic-appearing, vital signs reassuring, no obvious source infection noted on exam, respiratory panel and strep test were both negative.  Low suspicion patient suffering from lugwin angina, peritonsillar or retropharyngeal abscess as oropharynx was visualized tongue and uvula are both midline, no exudates noted on exam, patient was controlling her own secretions and difficulty.  Low suspicion patient would  be hospitalized due to a viral URI as he has new oxygen requirements, no signs of respiratory distress noted on exam.  I suspect patient has a viral URI which caused him some pharyngitis.  Will recommend over-the-counter pain medication and follow-up with PCP if symptoms worsen.  Patient's vital signs have remained stable, no indication for hospital admission.  Patient was given at home care as well strict return precautions. Final Clinical Impression(s) / ED Diagnoses Final diagnoses:  Viral upper respiratory tract infection    Rx / DC Orders ED Discharge Orders    None       Barnie Del 05/12/20 Alexis Goodell, MD 05/12/20 2312

## 2020-05-12 NOTE — ED Triage Notes (Signed)
Pt reports runny nose, generalized body aches, sore throat, cough with congestion for last several days. nad noted.

## 2020-05-12 NOTE — Discharge Instructions (Signed)
You have a viral URI.  I recommend taking over-the-counter pain medications like ibuprofen and or Tylenol every 6 hours as needed for pain.  Also recommend Claritin as this can help with your nasal decongestion.  Please stay hydrated and if you do not have an appetite I recommend soups as this will provide you with fluids as well as calories.  Please follow-up with your primary care provider in 7 days time if symptoms have not fully resolved.  Come back to emergency department if you develop chest pain, shortness of breath, severe abdominal pain, uncontrolled nausea, vomiting, diarrhea.

## 2020-06-02 IMAGING — DX RIGHT KNEE - COMPLETE 4+ VIEW
4 series · 4 of 4 positions shown · non-contrast
Comparison: None.

CLINICAL DATA: Right knee injury at work last night walking down
steps.

EXAM:
RIGHT KNEE - COMPLETE 4+ VIEW

[knee ap]
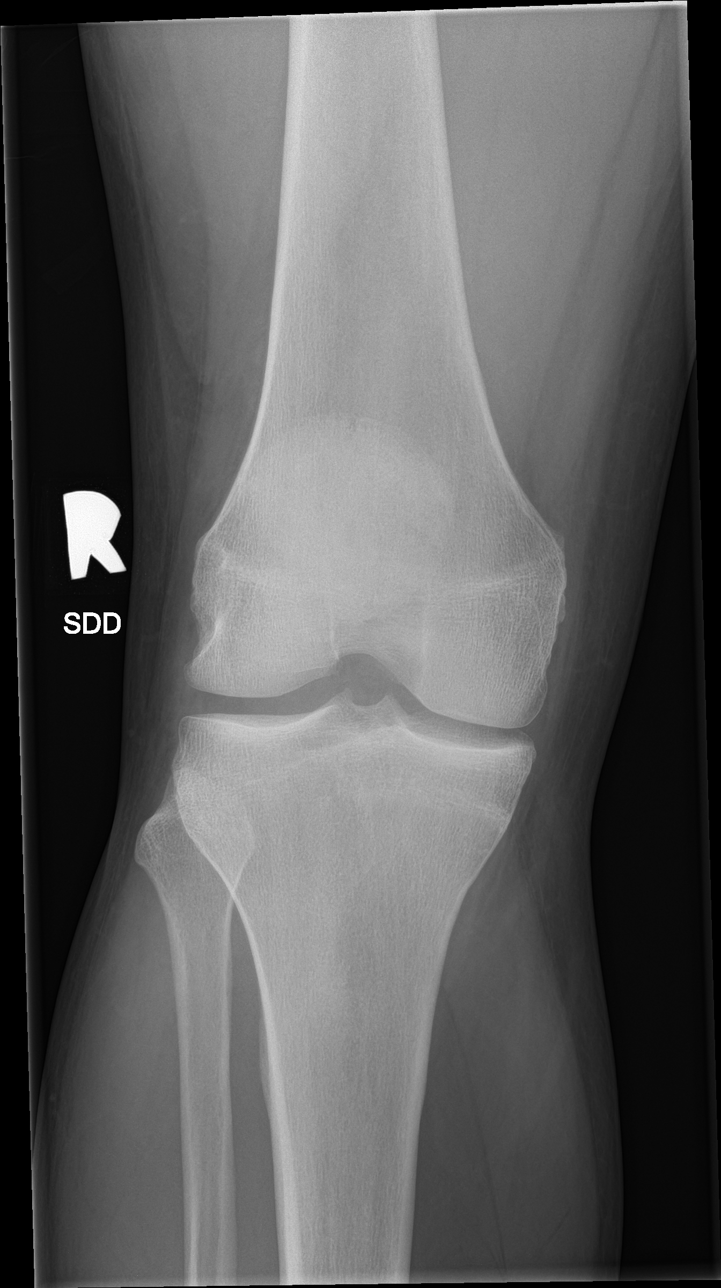

[knee lat]
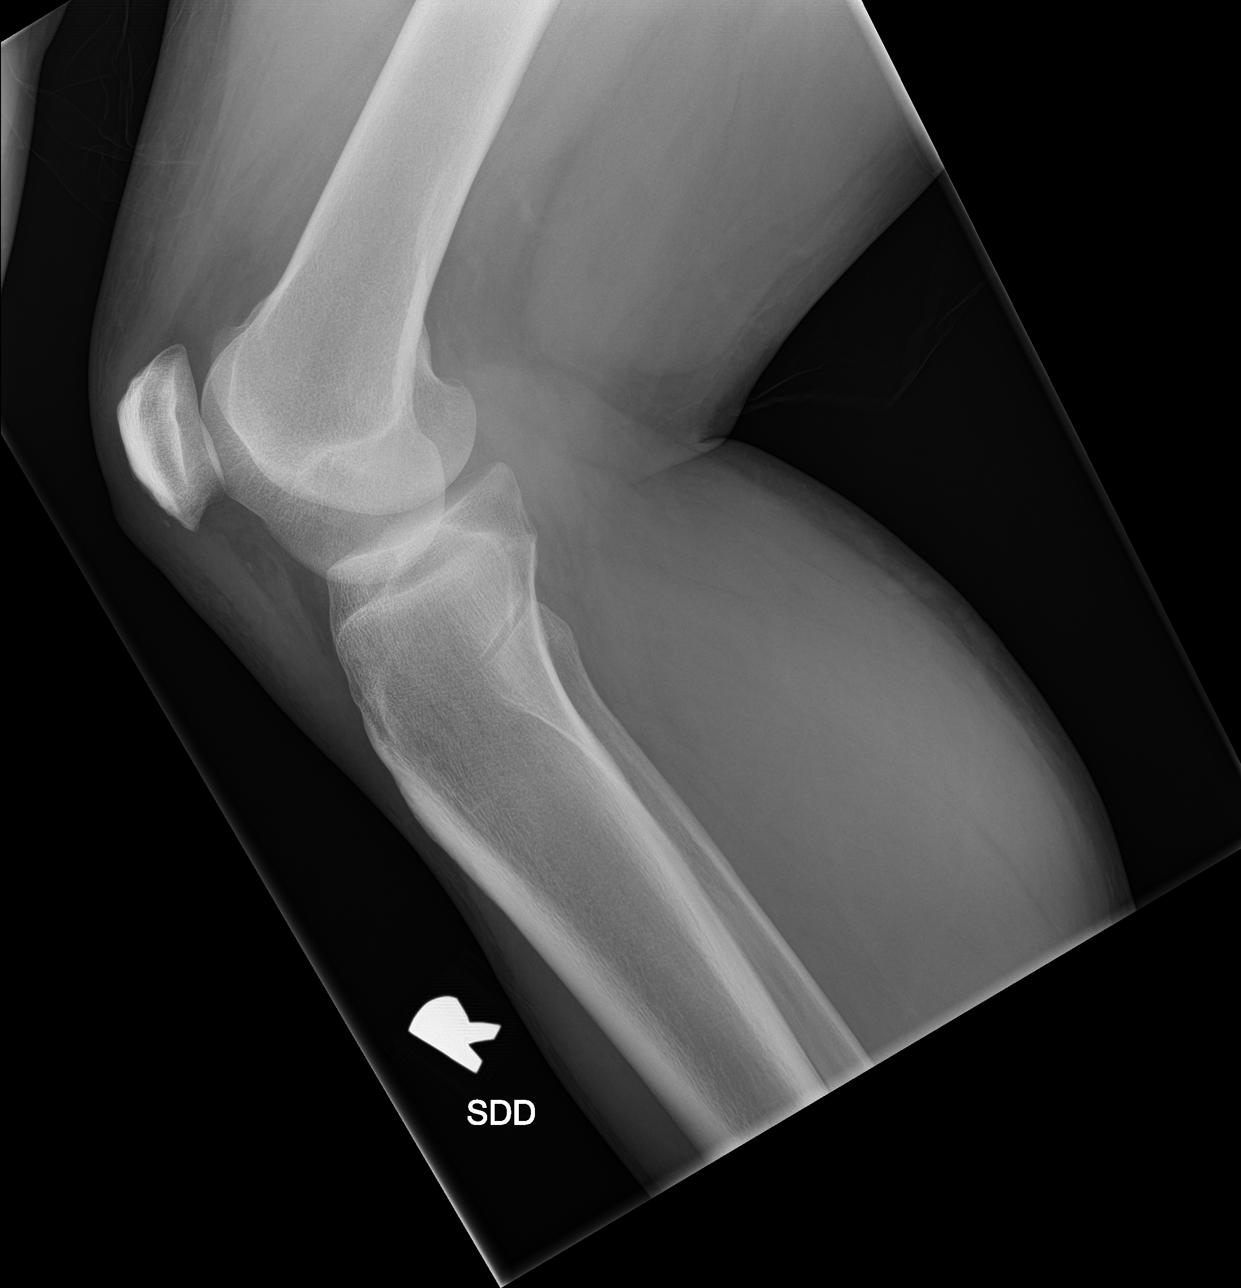

[knee obl (1 of 2)]
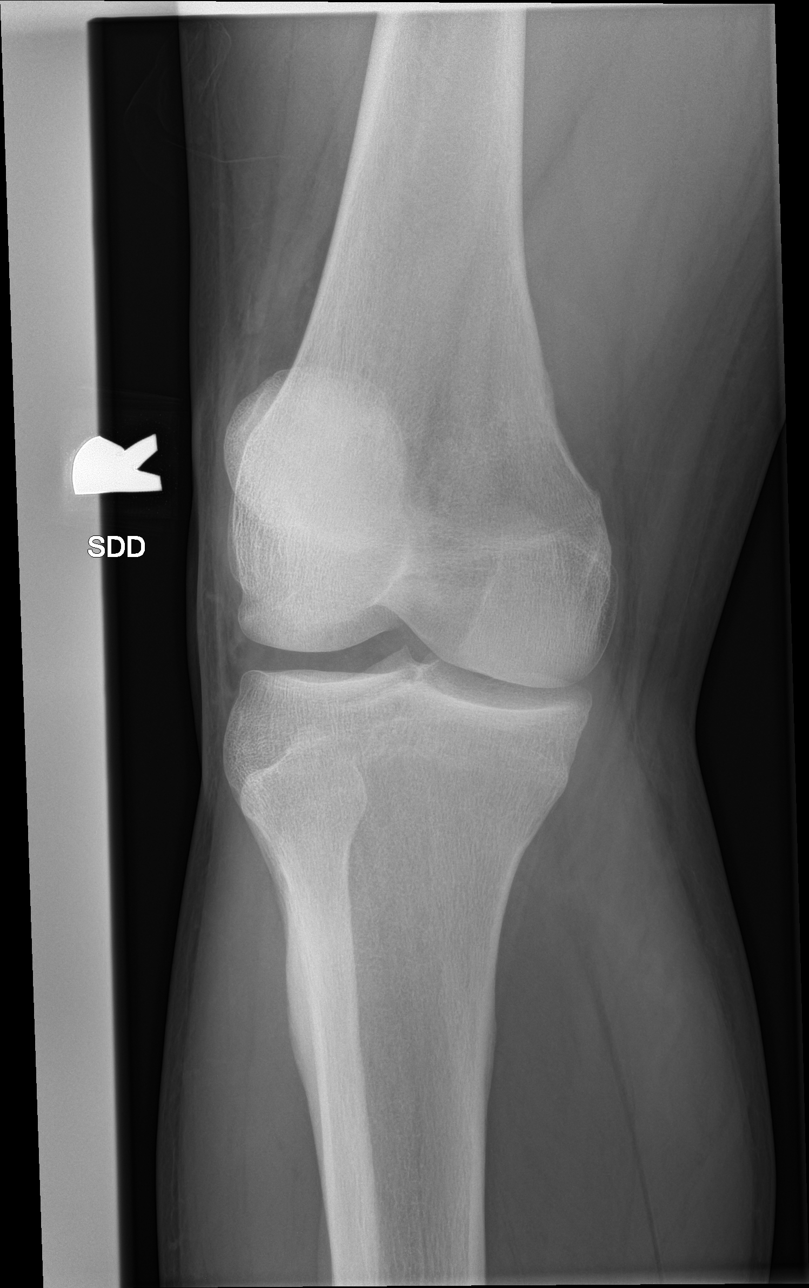

[knee obl (2 of 2)]
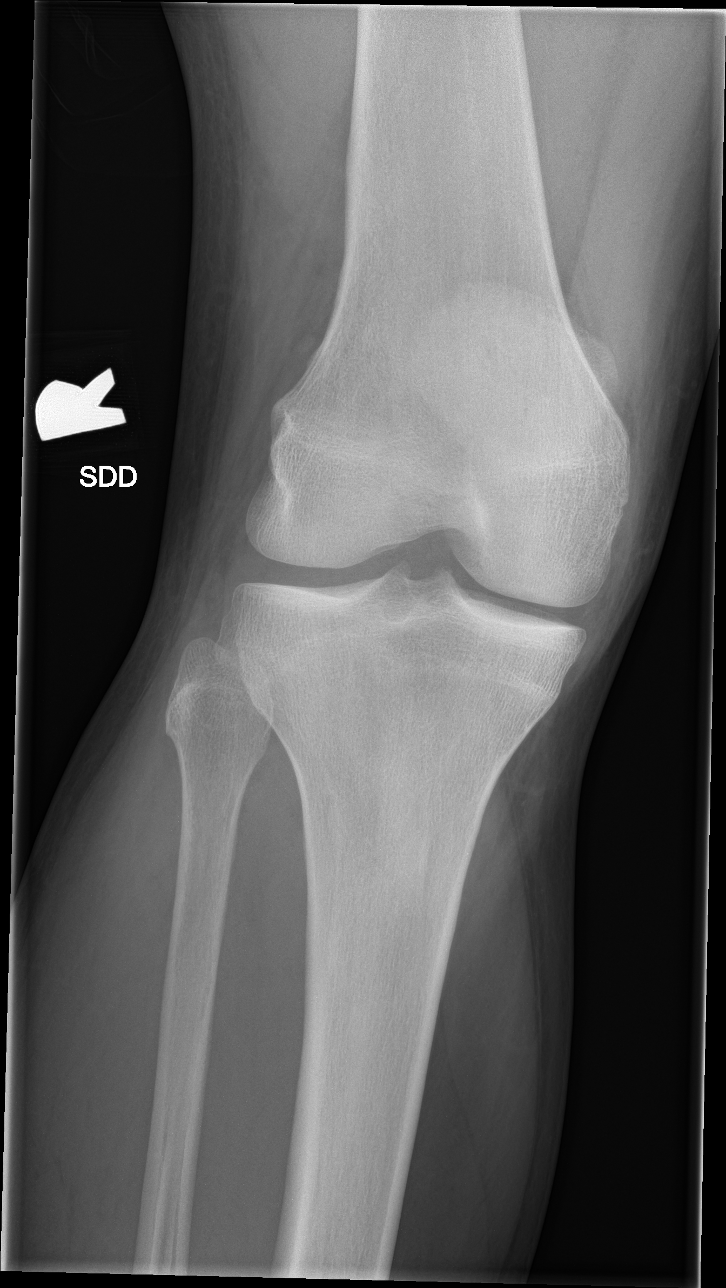

[4 of 4 positions shown; findings below may reference images not displayed]

FINDINGS: No evidence of fracture, dislocation, or joint effusion. No evidence
of arthropathy or other focal bone abnormality. Soft tissues are
unremarkable.
IMPRESSION: Negative.

## 2020-06-13 ENCOUNTER — Emergency Department (HOSPITAL_COMMUNITY)
Admission: EM | Admit: 2020-06-13 | Discharge: 2020-06-13 | Disposition: A | Discharge status: Home / Self Care | Payer: Managed Care, Other (non HMO) | Attending: Emergency Medicine | Admitting: Emergency Medicine

## 2020-06-13 ENCOUNTER — Encounter (HOSPITAL_COMMUNITY): Payer: Self-pay | Admitting: Emergency Medicine

## 2020-06-13 ENCOUNTER — Other Ambulatory Visit: Payer: Self-pay

## 2020-06-13 DIAGNOSIS — F17210 Nicotine dependence, cigarettes, uncomplicated: Secondary | ICD-10-CM | POA: Insufficient documentation

## 2020-06-13 DIAGNOSIS — M79675 Pain in left toe(s): Secondary | ICD-10-CM | POA: Diagnosis present

## 2020-06-13 DIAGNOSIS — M109 Gout, unspecified: Secondary | ICD-10-CM | POA: Insufficient documentation

## 2020-06-13 MED ORDER — PREDNISONE 20 MG PO TABS
60.0000 mg | ORAL_TABLET | Freq: Once | ORAL | Status: AC
  Administered 2020-06-13: 60 mg via ORAL
  Filled 2020-06-13: qty 3

## 2020-06-13 MED ORDER — INDOMETHACIN 50 MG PO CAPS: 50.0000 mg | ORAL_CAPSULE | Freq: Three times a day (TID) | ORAL | 0 refills | Status: DC

## 2020-06-13 NOTE — ED Triage Notes (Signed)
Patient stated he had two hamburgers and steak last week and he thinks that triggered a gout flare-up. Patient was able to walk with a limp, but now his L great toe is very swollen, hot and painful. Took an aleve w/o relief.

## 2020-06-13 NOTE — Discharge Instructions (Addendum)
Your history and physical exam is suggestive of acute gout flare.  Please read the attachments.  I have prescribed you indomethacin, please take as directed.  Do not combine with other NSAIDs.  It is important to get established with a primary care provider.  Return to the ED or seek immediate medical attention should you experience new or worsening symptoms.

## 2020-06-13 NOTE — ED Provider Notes (Signed)
[facility]-EMERGENCY DEPT Provider Note   CSN: [id number] Arrival date & time: 06/13/20  7425     History Chief Complaint  Patient presents with  . Toe Pain    Charles Price is a 44 y.o. male with PMH of gout who presents to the ED with complaints of left great toe pain.  Patient states that it began approximately 5 days ago and has been getting progressively worse.  He took Aleve this morning for the first time, with some relief.  Patient reports that he has had gout before and he endorses using red meat prior to symptom onset.  He denies any obvious precipitating trauma.  He also denies any fevers, chills, IVDA, recent STI/penile discharge, numbness or weakness, or other symptoms.  Patient works a physically demanding job at [employer] requires him to stand on his feet for extended periods of time.  His symptoms of left great toe pain are worse with ambulation.  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  No other significant past medical history.  HPI     History reviewed. No pertinent past medical history.  There are no problems to display for this patient.   History reviewed. No pertinent surgical history.     No family history on file.  Social History   Tobacco Use  . Smoking status: Current Every Day Smoker    Packs/day: 0.50    Types: Cigarettes  . Smokeless tobacco: Never Used  Vaping Use  . Vaping Use: Never used  Substance Use Topics  . Alcohol use: Yes    Comment: social  . Drug use: No    Home Medications Prior to Admission medications   Medication Sig Start Date End Date Taking? Authorizing Provider  indomethacin (INDOCIN) 50 MG capsule Take 1 capsule (50 mg total) by mouth 3 (three) times daily with meals for 5 days. 06/13/20 06/18/20  Lorelee New, PA-C    Allergies    Patient has no known allergies.  Review of Systems   Review of  Systems  Constitutional: Negative for chills and fever.  Genitourinary: Negative for discharge.  Musculoskeletal: Positive for arthralgias and gait problem.  Skin: Negative for wound.  Neurological: Negative for weakness and numbness.    Physical Exam Updated Vital Signs BP (!) 147/99   Pulse 74   Temp 98.3 F (36.8 C) (Oral)   Resp 16   Ht 6\' 2"  (1.88 m)   Wt 109 kg   SpO2 95%   BMI 30.85 kg/m   Physical Exam Vitals and nursing note reviewed. Exam conducted with a chaperone present.  Constitutional:      Appearance: Normal appearance.  HENT:     Head: Normocephalic and atraumatic.  Eyes:     General: No scleral icterus.    Conjunctiva/sclera: Conjunctivae normal.  Cardiovascular:     Rate and Rhythm: Normal rate.     Pulses: Normal pulses.  Pulmonary:     Effort: Pulmonary effort is normal.  Musculoskeletal:     Comments: Left foot: Inflammation and mild erythema involving first toe, particularly around the MTP joint.  TTP over first MTP.  Able to wiggle all toes.  No other areas of significant tenderness.  Capillary refill less than 2 seconds and pedal pulse intact.  Sensation intact throughout.  Skin:    General: Skin is dry.     Capillary Refill: Capillary refill  takes less than 2 seconds.  Neurological:     Mental Status: He is alert and oriented to person, place, and time.     GCS: GCS eye subscore is 4. GCS verbal subscore is 5. GCS motor subscore is 6.  Psychiatric:        Mood and Affect: Mood normal.        Behavior: Behavior normal.        Thought Content: Thought content normal.     ED Results / Procedures / Treatments   Labs (all labs ordered are listed, but only abnormal results are displayed) Labs Reviewed - No data to display  EKG None  Radiology No results found.  Procedures Procedures (including critical care time)  Medications Ordered in ED Medications  predniSONE (DELTASONE) tablet 60 mg (60 mg Oral Given 06/13/20 0949)    ED  Course  I have reviewed the triage vital signs and the nursing notes.  Pertinent labs & imaging results that were available during my care of the patient were reviewed by me and considered in my medical decision making (see chart for details).    MDM Rules/Calculators/A&P                          Patient's history and physical exam is suggestive of acute gout flare involving first MTP joint.  He has taken several Aleve this morning, will treat with 60 mg prednisone p.o. here in the ED.  We will then discharge him with a course of indomethacin 3 times daily x5 days.  Will encourage him to discontinue all other NSAIDs.  Low suspicion for septic arthritis given lack of risk factors or acute osseous abnormality given lack of trauma.  Patient is not on any diuretic medications.  Neurovascularly intact.  Reassuring exam.  ED return precautions discussed.  Patient voices understanding and is agreeable to the plan.  Final Clinical Impression(s) / ED Diagnoses Final diagnoses:  Acute gout involving toe of left foot, unspecified cause    Rx / DC Orders ED Discharge Orders         Ordered    indomethacin (INDOCIN) 50 MG capsule  3 times daily with meals        06/13/20 0940           Lorelee New, PA-C 06/13/20 0955    Virgina Norfolk, DO 06/13/20 1018

## 2020-06-14 ENCOUNTER — Telehealth (HOSPITAL_COMMUNITY): Payer: Self-pay | Admitting: Emergency Medicine

## 2020-06-14 DIAGNOSIS — M109 Gout, unspecified: Secondary | ICD-10-CM

## 2020-06-14 MED ORDER — INDOMETHACIN 50 MG PO CAPS: 50.0000 mg | ORAL_CAPSULE | Freq: Three times a day (TID) | ORAL | 0 refills | Status: AC

## 2020-06-14 NOTE — Telephone Encounter (Signed)
I was informed by secretary staff that patient's medicine had not been received at the pharmacy.\  I reviewed prior note and recent prescription to pharmacy.  Secretarial staff notified.

## 2020-09-02 ENCOUNTER — Emergency Department (HOSPITAL_COMMUNITY)
Admission: EM | Admit: 2020-09-02 | Discharge: 2020-09-02 | Disposition: A | Discharge status: Home / Self Care | Payer: Managed Care, Other (non HMO) | Attending: Emergency Medicine | Admitting: Emergency Medicine

## 2020-09-02 ENCOUNTER — Other Ambulatory Visit: Payer: Self-pay

## 2020-09-02 ENCOUNTER — Encounter (HOSPITAL_COMMUNITY): Payer: Self-pay | Admitting: Emergency Medicine

## 2020-09-02 DIAGNOSIS — M79672 Pain in left foot: Secondary | ICD-10-CM | POA: Insufficient documentation

## 2020-09-02 DIAGNOSIS — F17210 Nicotine dependence, cigarettes, uncomplicated: Secondary | ICD-10-CM | POA: Insufficient documentation

## 2020-09-02 DIAGNOSIS — M109 Gout, unspecified: Secondary | ICD-10-CM

## 2020-09-02 HISTORY — DX: Gout, unspecified: M10.9

## 2020-09-02 MED ORDER — HYDROCODONE-ACETAMINOPHEN 5-325 MG PO TABS: 2.0000 | ORAL_TABLET | ORAL | 0 refills | Status: DC | PRN

## 2020-09-02 MED ORDER — INDOMETHACIN 25 MG PO CAPS: 25.0000 mg | ORAL_CAPSULE | Freq: Three times a day (TID) | ORAL | 1 refills | Status: DC | PRN

## 2020-09-02 MED ORDER — HYDROCODONE-ACETAMINOPHEN 5-325 MG PO TABS: 1.0000 | ORAL_TABLET | Freq: Four times a day (QID) | ORAL | 0 refills | Status: DC | PRN

## 2020-09-02 MED ORDER — PREDNISONE 50 MG PO TABS
60.0000 mg | ORAL_TABLET | Freq: Once | ORAL | Status: AC
  Administered 2020-09-02: 60 mg via ORAL
  Filled 2020-09-02: qty 1

## 2020-09-02 MED ORDER — HYDROCODONE-ACETAMINOPHEN 5-325 MG PO TABS: 2.0000 | ORAL_TABLET | Freq: Four times a day (QID) | ORAL | 0 refills | Status: DC | PRN

## 2020-09-02 NOTE — Discharge Instructions (Addendum)
Begin taking indomethacin as prescribed.  Begin taking hydrocodone as prescribed as needed for pain.  Follow-up with your primary doctor if symptoms or not improving in the next few days.

## 2020-09-02 NOTE — ED Triage Notes (Signed)
Pt c/o left great toe pain since yesterday morning. Pt has history of gout.

## 2020-09-02 NOTE — ED Provider Notes (Signed)
[facility] EMERGENCY DEPARTMENT Provider Note   CSN: [id number] Arrival date & time: 09/02/20  0316     History Chief Complaint  Patient presents with  . Foot Pain    Charles Price is a 45 y.o. male.  Patient is a 45 year old male with history of gout.  He presents today with complaints of pain in his left MTP joint worsening over the past 24 hours.  He denies any injury or trauma.  This feels similar to gout.  He denies any fevers or chills.  Pain is worse with any range of motion.  There are no alleviating factors.  The history is provided by the patient.       Past Medical History:  Diagnosis Date  . Gout     There are no problems to display for this patient.   No past surgical history on file.     No family history on file.  Social History   Tobacco Use  . Smoking status: Current Every Day Smoker    Packs/day: 0.50    Types: Cigarettes  . Smokeless tobacco: Never Used  Vaping Use  . Vaping Use: Never used  Substance Use Topics  . Alcohol use: Yes    Comment: social  . Drug use: No    Home Medications Prior to Admission medications   Not on File    Allergies    Patient has no known allergies.  Review of Systems   Review of Systems  All other systems reviewed and are negative.   Physical Exam Updated Vital Signs BP (!) 157/102   Pulse 68   Temp 98.1 F (36.7 C)   Resp 18   Ht 6\' 2"  (1.88 m)   Wt 108.9 kg   SpO2 96%   BMI 30.81 kg/m   Physical Exam Vitals and nursing note reviewed.  Constitutional:      General: He is not in acute distress.    Appearance: Normal appearance.  HENT:     Head: Normocephalic and atraumatic.  Pulmonary:     Effort: Pulmonary effort is normal.  Musculoskeletal:     Comments: The left MTP joint is somewhat swollen and tender to the touch.  There is slight warmth to the joint.  He has severe pain with any range of motion.  Skin:    General: Skin is warm and dry.  Neurological:     Mental Status:  He is alert.     ED Results / Procedures / Treatments   Labs (all labs ordered are listed, but only abnormal results are displayed) Labs Reviewed - No data to display  EKG None  Radiology No results found.  Procedures Procedures (including critical care time)  Medications Ordered in ED Medications  predniSONE (DELTASONE) tablet 60 mg (has no administration in time range)    ED Course  I have reviewed the triage vital signs and the nursing notes.  Pertinent labs & imaging results that were available during my care of the patient were reviewed by me and considered in my medical decision making (see chart for details).    MDM Rules/Calculators/A&P  Will treat with p.o. prednisone and discharge with Indocin as this is what was successful before.  I highly doubt a septic joint.  He has no fevers and no risk factors for this.  Final Clinical Impression(s) / ED Diagnoses Final diagnoses:  None    Rx / DC Orders ED Discharge Orders    None  Geoffery Lyons, MD 09/02/20 [phone]

## 2020-09-03 MED FILL — Hydrocodone-Acetaminophen Tab 5-325 MG: ORAL | Qty: 6 | Status: AC

## 2020-09-29 ENCOUNTER — Encounter (HOSPITAL_COMMUNITY): Payer: Self-pay | Admitting: *Deleted

## 2020-09-29 ENCOUNTER — Other Ambulatory Visit: Payer: Self-pay

## 2020-09-29 ENCOUNTER — Emergency Department (HOSPITAL_COMMUNITY)
Admission: EM | Admit: 2020-09-29 | Discharge: 2020-09-29 | Disposition: A | Discharge status: Home / Self Care | Payer: Self-pay | Attending: Emergency Medicine | Admitting: Emergency Medicine

## 2020-09-29 DIAGNOSIS — F17210 Nicotine dependence, cigarettes, uncomplicated: Secondary | ICD-10-CM | POA: Insufficient documentation

## 2020-09-29 DIAGNOSIS — M79671 Pain in right foot: Secondary | ICD-10-CM | POA: Insufficient documentation

## 2020-09-29 DIAGNOSIS — Z8739 Personal history of other diseases of the musculoskeletal system and connective tissue: Secondary | ICD-10-CM | POA: Insufficient documentation

## 2020-09-29 LAB — I-STAT CHEM 8, ED
BUN: 16 mg/dL (ref 6–20)
Calcium, Ion: 1.23 mmol/L (ref 1.15–1.40)
Chloride: 103 mmol/L (ref 98–111)
Creatinine, Ser: 1.1 mg/dL (ref 0.61–1.24)
Glucose, Bld: 81 mg/dL (ref 70–99)
HCT: 46 % (ref 39.0–52.0)
Hemoglobin: 15.6 g/dL (ref 13.0–17.0)
Potassium: 4 mmol/L (ref 3.5–5.1)
Sodium: 140 mmol/L (ref 135–145)
TCO2: 26 mmol/L (ref 22–32)

## 2020-09-29 MED ORDER — HYDROCODONE-ACETAMINOPHEN 5-325 MG PO TABS: ORAL_TABLET | ORAL | 0 refills | Status: DC

## 2020-09-29 NOTE — ED Triage Notes (Signed)
Right foot pain, history of gout

## 2020-09-29 NOTE — ED Provider Notes (Signed)
[facility] EMERGENCY DEPARTMENT Provider Note   CSN: [id number] Arrival date & time: 09/29/20  1006     History Chief Complaint  Patient presents with  . Foot Pain    Charles Price is a 45 y.o. male.  HPI      Charles Price is a 45 y.o. male with past medical history of gout who presents to the Emergency Department complaining of pain to the lateral aspect of his right foot.  Symptoms began several days ago.  Gradually worsening.  He states that he was seen here in January with similar symptoms involving the right fifth toe.  Symptoms improved after taking indomethacin.  He endorses eating pork recently.  No known injury.  Pain is worse with palpation and weightbearing.  Improves at rest.  Denies numbness of his toes or radiating pains to his ankle.  No recent skin changes, open wounds, fever or chills.   Past Medical History:  Diagnosis Date  . Gout     There are no problems to display for this patient.   History reviewed. No pertinent surgical history.     No family history on file.  Social History   Tobacco Use  . Smoking status: Current Every Day Smoker    Packs/day: 0.50    Types: Cigarettes  . Smokeless tobacco: Never Used  Vaping Use  . Vaping Use: Never used  Substance Use Topics  . Alcohol use: Yes    Comment: social  . Drug use: No    Home Medications Prior to Admission medications   Medication Sig Start Date End Date Taking? Authorizing Provider  HYDROcodone-acetaminophen (NORCO) 5-325 MG tablet Take 1-2 tablets by mouth every 6 (six) hours as needed. 09/02/20   Geoffery Lyons, MD  HYDROcodone-acetaminophen (NORCO) 5-325 MG tablet Take 2 tablets by mouth every 4 (four) hours as needed. 09/02/20   Geoffery Lyons, MD  HYDROcodone-acetaminophen (NORCO) 5-325 MG tablet Take 2 tablets by mouth every 6 (six) hours as needed. 09/02/20   Geoffery Lyons, MD  HYDROcodone-acetaminophen (NORCO) 5-325 MG tablet Take 1-2 tablets by mouth every 6 (six) hours as  needed. 09/02/20   Geoffery Lyons, MD  indomethacin (INDOCIN) 25 MG capsule Take 1 capsule (25 mg total) by mouth 3 (three) times daily as needed. 09/02/20   Geoffery Lyons, MD    Allergies    Patient has no known allergies.  Review of Systems   Review of Systems  Constitutional: Negative for chills and fever.  Respiratory: Negative for chest tightness.   Cardiovascular: Negative for chest pain.  Gastrointestinal: Negative for nausea and vomiting.  Genitourinary: Negative for difficulty urinating and dysuria.  Musculoskeletal: Positive for arthralgias (Right foot pain) and joint swelling.  Skin: Negative for color change and wound.  Neurological: Negative for weakness, numbness and headaches.    Physical Exam Updated Vital Signs BP 116/83 (BP Location: Right Arm)   Pulse 80   Temp 98.2 F (36.8 C) (Oral)   Resp 18   Ht 6\' 2"  (1.88 m)   Wt 108 kg   SpO2 100%   BMI 30.56 kg/m   Physical Exam Vitals and nursing note reviewed.  Constitutional:      General: He is not in acute distress.    Appearance: Normal appearance.  HENT:     Head: Atraumatic.  Cardiovascular:     Rate and Rhythm: Normal rate and regular rhythm.     Pulses: Normal pulses.  Pulmonary:     Effort: Pulmonary effort is normal.  Breath sounds: Normal breath sounds.  Chest:     Chest wall: No tenderness.  Abdominal:     Palpations: Abdomen is soft.     Tenderness: There is no abdominal tenderness.  Musculoskeletal:        General: Tenderness present. No signs of injury.     Comments: Tenderness to palpation along the lateral aspect of the right foot, mostly at midfoot.  Localized area of mild erythema.  Right ankle nontender.  No calf pain or swelling.  No open wounds or skin changes seen on exam. Toes non tender  Skin:    General: Skin is warm.     Capillary Refill: Capillary refill takes less than 2 seconds.     Findings: Erythema present.  Neurological:     General: No focal deficit present.      Mental Status: He is alert.     Sensory: No sensory deficit.     Motor: No weakness.     ED Results / Procedures / Treatments   Labs (all labs ordered are listed, but only abnormal results are displayed) Labs Reviewed  I-STAT CHEM 8, ED    EKG None  Radiology No results found.   Procedures Procedures   Medications Ordered in ED Medications - No data to display  ED Course  I have reviewed the triage vital signs and the nursing notes.  Pertinent labs & imaging results that were available during my care of the patient were reviewed by me and considered in my medical decision making (see chart for details).    MDM Rules/Calculators/A&P                          Patient here with known history of gout.  Having lateral right foot pain for several days.  States symptoms are similar to previous gout flares.  No recent injury or trauma.  No skin changes.  Neurovascularly intact.  On review of medical records, patient was seen here in January for gout of the left MTP joint.  He notes improvement of previous symptoms after taking indomethacin.  No recent labs.  Will check creatinine.   will give referral information for him to establish primary care.  Creatinine unremarkable.  He has a refill on his indomethacin.  He will also be given orthopedic f/u  Final Clinical Impression(s) / ED Diagnoses Final diagnoses:  Foot pain, right  History of gout    Rx / DC Orders ED Discharge Orders    None       Pauline Aus, PA-C 09/29/20 1852    Terrilee Files, MD 09/29/20 [phone]

## 2020-09-29 NOTE — ED Notes (Addendum)
Entered room and introduced self to patient. Pt appears to be resting in bed, respirations are even and unlabored with equal chest rise and fall. Bed is locked in the lowest position, side rails x1 at patient request, call bell within reach. Pt educated on call light use and hourly rounding, verbalized understanding and in agreement at this time. All questions and concerns voiced addressed. Refreshments offered and provided per patient request.

## 2020-09-29 NOTE — Discharge Instructions (Addendum)
Your kidney test today were reassuring.  Please have your indomethacin refilled and start taking as directed.  You have been prescribed a short course of pain medicine to take as well.  Please contact one of the primary care providers listed to establish primary care.  You may also follow-up with Dr. Dallas Schimke who is a orthopedic provider if needed.

## 2022-07-08 ENCOUNTER — Observation Stay (HOSPITAL_COMMUNITY)
Admission: EM | Admit: 2022-07-08 | Discharge: 2022-07-09 | Disposition: A | Discharge status: Home / Self Care | Payer: Managed Care, Other (non HMO) | Attending: Internal Medicine | Admitting: Internal Medicine

## 2022-07-08 DIAGNOSIS — F17210 Nicotine dependence, cigarettes, uncomplicated: Secondary | ICD-10-CM | POA: Insufficient documentation

## 2022-07-08 DIAGNOSIS — Z794 Long term (current) use of insulin: Secondary | ICD-10-CM | POA: Diagnosis not present

## 2022-07-08 DIAGNOSIS — E860 Dehydration: Secondary | ICD-10-CM | POA: Diagnosis not present

## 2022-07-08 DIAGNOSIS — Z79899 Other long term (current) drug therapy: Secondary | ICD-10-CM | POA: Insufficient documentation

## 2022-07-08 DIAGNOSIS — E1065 Type 1 diabetes mellitus with hyperglycemia: Secondary | ICD-10-CM | POA: Diagnosis present

## 2022-07-08 DIAGNOSIS — E875 Hyperkalemia: Secondary | ICD-10-CM | POA: Diagnosis not present

## 2022-07-08 DIAGNOSIS — E1010 Type 1 diabetes mellitus with ketoacidosis without coma: Principal | ICD-10-CM

## 2022-07-08 DIAGNOSIS — F17200 Nicotine dependence, unspecified, uncomplicated: Secondary | ICD-10-CM | POA: Insufficient documentation

## 2022-07-08 DIAGNOSIS — N179 Acute kidney failure, unspecified: Secondary | ICD-10-CM | POA: Diagnosis not present

## 2022-07-08 DIAGNOSIS — E1110 Type 2 diabetes mellitus with ketoacidosis without coma: Secondary | ICD-10-CM | POA: Diagnosis present

## 2022-07-09 ENCOUNTER — Encounter (HOSPITAL_COMMUNITY): Payer: Self-pay

## 2022-07-09 ENCOUNTER — Other Ambulatory Visit: Payer: Self-pay

## 2022-07-09 DIAGNOSIS — N179 Acute kidney failure, unspecified: Secondary | ICD-10-CM

## 2022-07-09 DIAGNOSIS — F17200 Nicotine dependence, unspecified, uncomplicated: Secondary | ICD-10-CM

## 2022-07-09 DIAGNOSIS — E1110 Type 2 diabetes mellitus with ketoacidosis without coma: Secondary | ICD-10-CM

## 2022-07-09 DIAGNOSIS — E875 Hyperkalemia: Secondary | ICD-10-CM

## 2022-07-09 LAB — URINALYSIS, ROUTINE W REFLEX MICROSCOPIC
Bacteria, UA: NONE SEEN
Bilirubin Urine: NEGATIVE
Glucose, UA: >=500 mg/dL — AB
Ketones, ur: 20 mg/dL — AB
Leukocytes,Ua: NEGATIVE
Nitrite: NEGATIVE
Protein, ur: NEGATIVE mg/dL
Specific Gravity, Urine: 1.028 (ref 1.005–1.030)
pH: 5 (ref 5.0–8.0)

## 2022-07-09 LAB — BASIC METABOLIC PANEL
Anion gap: 12 (ref 5–15)
Anion gap: 11 (ref 5–15)
Anion gap: 11 (ref 5–15)
BUN: 20 mg/dL (ref 6–20)
BUN: 17 mg/dL (ref 6–20)
BUN: 16 mg/dL (ref 6–20)
CO2: 21 mmol/L — ABNORMAL LOW (ref 22–32)
CO2: 22 mmol/L (ref 22–32)
CO2: 23 mmol/L (ref 22–32)
Calcium: 9.1 mg/dL (ref 8.9–10.3)
Calcium: 8.8 mg/dL — ABNORMAL LOW (ref 8.9–10.3)
Calcium: 9.1 mg/dL (ref 8.9–10.3)
Chloride: 100 mmol/L (ref 98–111)
Chloride: 101 mmol/L (ref 98–111)
Chloride: 102 mmol/L (ref 98–111)
Creatinine, Ser: 0.89 mg/dL (ref 0.61–1.24)
Creatinine, Ser: 0.79 mg/dL (ref 0.61–1.24)
Creatinine, Ser: 0.83 mg/dL (ref 0.61–1.24)
GFR, Estimated: >60 mL/min (ref >60)
GFR, Estimated: >60 mL/min (ref >60)
GFR, Estimated: >60 mL/min (ref >60)
Glucose, Bld: 205 mg/dL — ABNORMAL HIGH (ref 70–99)
Glucose, Bld: 192 mg/dL — ABNORMAL HIGH (ref 70–99)
Glucose, Bld: 175 mg/dL — ABNORMAL HIGH (ref 70–99)
Potassium: 3.9 mmol/L (ref 3.5–5.1)
Potassium: 3.7 mmol/L (ref 3.5–5.1)
Potassium: 3.8 mmol/L (ref 3.5–5.1)
Sodium: 133 mmol/L — ABNORMAL LOW (ref 135–145)
Sodium: 134 mmol/L — ABNORMAL LOW (ref 135–145)
Sodium: 136 mmol/L (ref 135–145)

## 2022-07-09 LAB — CBC WITH DIFFERENTIAL/PLATELET
Abs Immature Granulocytes: 0.06 10*3/uL (ref 0.00–0.07)
Basophils Absolute: 0.1 10*3/uL (ref 0.0–0.1)
Basophils Relative: 1 %
Eosinophils Absolute: 0.2 10*3/uL (ref 0.0–0.5)
Eosinophils Relative: 3 %
HCT: 39.2 % (ref 39.0–52.0)
Hemoglobin: 14.5 g/dL (ref 13.0–17.0)
Immature Granulocytes: 1 %
Lymphocytes Relative: 38 %
Lymphs Abs: 2.6 10*3/uL (ref 0.7–4.0)
MCH: 29.5 pg (ref 26.0–34.0)
MCHC: 37 g/dL — ABNORMAL HIGH (ref 30.0–36.0)
MCV: 79.8 fL — ABNORMAL LOW (ref 80.0–100.0)
Monocytes Absolute: 0.6 10*3/uL (ref 0.1–1.0)
Monocytes Relative: 8 %
Neutro Abs: 3.4 10*3/uL (ref 1.7–7.7)
Neutrophils Relative %: 49 %
Platelets: 180 10*3/uL (ref 150–400)
RBC: 4.91 MIL/uL (ref 4.22–5.81)
RDW: 14.1 % (ref 11.5–15.5)
WBC: 6.9 10*3/uL (ref 4.0–10.5)
nRBC: 0 % (ref 0.0–0.2)

## 2022-07-09 LAB — CBC
HCT: 41.3 % (ref 39.0–52.0)
Hemoglobin: 15.3 g/dL (ref 13.0–17.0)
MCH: 29.1 pg (ref 26.0–34.0)
MCHC: 37 g/dL — ABNORMAL HIGH (ref 30.0–36.0)
MCV: 78.7 fL — ABNORMAL LOW (ref 80.0–100.0)
Platelets: 215 10*3/uL (ref 150–400)
RBC: 5.25 MIL/uL (ref 4.22–5.81)
RDW: 14.1 % (ref 11.5–15.5)
WBC: 7.4 10*3/uL (ref 4.0–10.5)
nRBC: 0 % (ref 0.0–0.2)

## 2022-07-09 LAB — COMPREHENSIVE METABOLIC PANEL
ALT: 31 U/L (ref 0–44)
AST: 36 U/L (ref 15–41)
Albumin: 3.9 g/dL (ref 3.5–5.0)
Alkaline Phosphatase: 171 U/L — ABNORMAL HIGH (ref 38–126)
Anion gap: 18 — ABNORMAL HIGH (ref 5–15)
BUN: 26 mg/dL — ABNORMAL HIGH (ref 6–20)
CO2: 17 mmol/L — ABNORMAL LOW (ref 22–32)
Calcium: 9.5 mg/dL (ref 8.9–10.3)
Chloride: 95 mmol/L — ABNORMAL LOW (ref 98–111)
Creatinine, Ser: 1.6 mg/dL — ABNORMAL HIGH (ref 0.61–1.24)
GFR, Estimated: 54 mL/min — ABNORMAL LOW (ref >60)
Glucose, Bld: 550 mg/dL (ref 70–99)
Potassium: 5.2 mmol/L — ABNORMAL HIGH (ref 3.5–5.1)
Sodium: 130 mmol/L — ABNORMAL LOW (ref 135–145)
Total Bilirubin: 2.2 mg/dL — ABNORMAL HIGH (ref 0.3–1.2)
Total Protein: 7.8 g/dL (ref 6.5–8.1)

## 2022-07-09 LAB — BLOOD GAS, VENOUS
Acid-base deficit: 1.5 mmol/L (ref 0.0–2.0)
Bicarbonate: 22.9 mmol/L (ref 20.0–28.0)
O2 Saturation: 80 %
Patient temperature: 36.9
pCO2, Ven: 37 mmHg — ABNORMAL LOW (ref 44–60)
pH, Ven: 7.4 (ref 7.25–7.43)
pO2, Ven: 49 mmHg — ABNORMAL HIGH (ref 32–45)

## 2022-07-09 LAB — CBG MONITORING, ED
Glucose-Capillary: 184 mg/dL — ABNORMAL HIGH (ref 70–99)
Glucose-Capillary: 186 mg/dL — ABNORMAL HIGH (ref 70–99)
Glucose-Capillary: 196 mg/dL — ABNORMAL HIGH (ref 70–99)
Glucose-Capillary: 205 mg/dL — ABNORMAL HIGH (ref 70–99)
Glucose-Capillary: 210 mg/dL — ABNORMAL HIGH (ref 70–99)
Glucose-Capillary: 392 mg/dL — ABNORMAL HIGH (ref 70–99)
Glucose-Capillary: 440 mg/dL — ABNORMAL HIGH (ref 70–99)
Glucose-Capillary: 538 mg/dL (ref 70–99)

## 2022-07-09 LAB — TSH: TSH: 3.522 u[IU]/mL (ref 0.350–4.500)

## 2022-07-09 LAB — MAGNESIUM: Magnesium: 3.1 mg/dL — ABNORMAL HIGH (ref 1.7–2.4)

## 2022-07-09 LAB — GLUCOSE, CAPILLARY
Glucose-Capillary: 198 mg/dL — ABNORMAL HIGH (ref 70–99)
Glucose-Capillary: 186 mg/dL — ABNORMAL HIGH (ref 70–99)
Glucose-Capillary: 188 mg/dL — ABNORMAL HIGH (ref 70–99)
Glucose-Capillary: 143 mg/dL — ABNORMAL HIGH (ref 70–99)
Glucose-Capillary: 139 mg/dL — ABNORMAL HIGH (ref 70–99)
Glucose-Capillary: 221 mg/dL — ABNORMAL HIGH (ref 70–99)

## 2022-07-09 LAB — MRSA NEXT GEN BY PCR, NASAL: MRSA by PCR Next Gen: NOT DETECTED

## 2022-07-09 LAB — BETA-HYDROXYBUTYRIC ACID
Beta-Hydroxybutyric Acid: 3.81 mmol/L — ABNORMAL HIGH (ref 0.05–0.27)
Beta-Hydroxybutyric Acid: 2.37 mmol/L — ABNORMAL HIGH (ref 0.05–0.27)

## 2022-07-09 LAB — HIV ANTIBODY (ROUTINE TESTING W REFLEX): HIV Screen 4th Generation wRfx: NONREACTIVE

## 2022-07-09 MED ORDER — LACTATED RINGERS IV SOLN: INTRAVENOUS | Status: DC

## 2022-07-09 MED ORDER — ACETAMINOPHEN 650 MG RE SUPP: 650.0000 mg | Freq: Four times a day (QID) | RECTAL | Status: DC | PRN

## 2022-07-09 MED ORDER — INSULIN ASPART 100 UNIT/ML IJ SOLN: 0.0000 [IU] | Freq: Every day | INTRAMUSCULAR | Status: DC

## 2022-07-09 MED ORDER — INSULIN PEN NEEDLE 32G X 4 MM MISC: 0 refills | Status: AC

## 2022-07-09 MED ORDER — ONDANSETRON HCL 4 MG PO TABS: 4.0000 mg | ORAL_TABLET | Freq: Four times a day (QID) | ORAL | Status: DC | PRN

## 2022-07-09 MED ORDER — INSULIN GLARGINE-YFGN 100 UNIT/ML ~~LOC~~ SOLN
40.0000 [IU] | Freq: Every day | SUBCUTANEOUS | Status: DC
  Administered 2022-07-09: 40 [IU] via SUBCUTANEOUS
  Filled 2022-07-09 (×2): qty 0.4

## 2022-07-09 MED ORDER — LIVING WELL WITH DIABETES BOOK: Freq: Once | Status: AC

## 2022-07-09 MED ORDER — LACTATED RINGERS IV BOLUS
20.0000 mL/kg | Freq: Once | INTRAVENOUS | Status: AC
  Administered 2022-07-09: 2722 mL via INTRAVENOUS

## 2022-07-09 MED ORDER — INSULIN REGULAR(HUMAN) IN NACL 100-0.9 UT/100ML-% IV SOLN
INTRAVENOUS | Status: DC
  Administered 2022-07-09: 15 [IU]/h via INTRAVENOUS
  Filled 2022-07-09: qty 100

## 2022-07-09 MED ORDER — DEXTROSE IN LACTATED RINGERS 5 % IV SOLN: INTRAVENOUS | Status: DC

## 2022-07-09 MED ORDER — DEXTROSE 50 % IV SOLN: 0.0000 mL | INTRAVENOUS | Status: DC | PRN

## 2022-07-09 MED ORDER — HEPARIN SODIUM (PORCINE) 5000 UNIT/ML IJ SOLN
5000.0000 [IU] | Freq: Three times a day (TID) | INTRAMUSCULAR | Status: DC
  Administered 2022-07-09 (×2): 5000 [IU] via SUBCUTANEOUS
  Filled 2022-07-09: qty 1

## 2022-07-09 MED ORDER — CHLORHEXIDINE GLUCONATE CLOTH 2 % EX PADS
6.0000 | MEDICATED_PAD | Freq: Every day | CUTANEOUS | Status: DC
  Administered 2022-07-09: 6 via TOPICAL

## 2022-07-09 MED ORDER — INSULIN ASPART 100 UNIT/ML IJ SOLN
0.0000 [IU] | Freq: Three times a day (TID) | INTRAMUSCULAR | Status: DC
  Administered 2022-07-09: 5 [IU] via SUBCUTANEOUS

## 2022-07-09 MED ORDER — INSULIN DETEMIR 100 UNIT/ML FLEXPEN: 45.0000 [IU] | PEN_INJECTOR | Freq: Every day | SUBCUTANEOUS | 11 refills | Status: AC

## 2022-07-09 MED ORDER — ACETAMINOPHEN 325 MG PO TABS: 650.0000 mg | ORAL_TABLET | Freq: Four times a day (QID) | ORAL | Status: DC | PRN

## 2022-07-09 MED ORDER — MORPHINE SULFATE (PF) 2 MG/ML IV SOLN: 2.0000 mg | INTRAVENOUS | Status: DC | PRN

## 2022-07-09 MED ORDER — ONDANSETRON HCL 4 MG/2ML IJ SOLN: 4.0000 mg | Freq: Four times a day (QID) | INTRAMUSCULAR | Status: DC | PRN

## 2022-07-09 MED ORDER — OXYCODONE HCL 5 MG PO TABS: 5.0000 mg | ORAL_TABLET | ORAL | Status: DC | PRN

## 2022-07-09 MED ORDER — SODIUM CHLORIDE 0.9 % IV BOLUS
1000.0000 mL | Freq: Once | INTRAVENOUS | Status: AC
  Administered 2022-07-09: 1000 mL via INTRAVENOUS

## 2022-07-09 MED ORDER — BLOOD GLUCOSE METER KIT: PACK | 0 refills | Status: AC

## 2022-07-09 NOTE — Inpatient Diabetes Management (Signed)
Inpatient Diabetes Program Recommendations  AACE/ADA: New Consensus Statement on Inpatient Glycemic Control (2015)  Target Ranges:  Prepandial:   less than 140 mg/dL      Peak postprandial:   less than 180 mg/dL (1-2 hours)      Critically ill patients:  140 - 180 mg/dL   Lab Results  Component Value Date   GLUCAP 186 (H) 07/09/2022    Review of Glycemic Control  Latest Reference Range & Units 07/09/22 00:09 07/09/22 02:00 07/09/22 02:45 07/09/22 03:52 07/09/22 05:05 07/09/22 06:26 07/09/22 07:23  Glucose-Capillary 70 - 99 mg/dL 538 (HH) 440 (H) 392 (H) 184 (H) 196 (H) 205 (H) 186 (H)   Diabetes history: New Diabetes Diagnosis  Current orders for Inpatient glycemic control:  IV insulin/DKA/Endotool  A1c pending Pt may more than likely need insulin at time of d/c based on glucose levels on presentation and current insulin gtt rates    Inpatient Diabetes Program Recommendations:    Pt receiving IV insulin 4.4-5 units/hour. Pt is 136.1 kg.   At time of transition consider: -  Semglee 45 units (0.3 units/kg) -  Novolog 0-15 units tid + hs (if pt transitions off IV insulin this evening, please order Correction scale Q4)  Called pt regarding new Diabetes diagnosis. Discussed glucose levels on admission. Discussed insulin gtt rates and the need for insulin at time of d/c. Spoke with pt about glucose goals at home needing to be at an average of 150 mg/dl all the time (correlating to an A1c of 7%).  Discussed basic pathophysiology of DM Type 2, basic home care, importance of checking CBGs and maintaining good CBG control to prevent long-term and short-term complications.  Reviewed signs and symptoms of hyperglycemia and hypoglycemia along with treatment for both. Discussed impact of nutrition, exercise, stress, sickness, and medications on diabetes control. Pt reports his mother and sister have Diabetes and take insulin. He also says he has 2 nurses in the family that he can ask questions to  and have as a support.  Asked patient to check his glucose 2 times per day (fasting and alternating second check) and to keep a log book of glucose readings and insulin taken. Explained how the doctor he follows up with can use the log book to continue to make insulin adjustments if needed. Discussed operating an insulin pen and will attach QR video on AVS for pt to see for reference at home.  Patient verbalized understanding of information discussed and he states that he has no further questions at this time related to diabetes.   RNs to provide ongoing basic DM education at bedside with this patient and engage patient to actively check blood glucose and administer insulin injections.   D/C supplies needed: Blood glucose meter kit order # [id number] Insulin pen needles order # [id number] Levemir flex pen looks to be covered by insurance order # [phone]    Thanks,  Tama Headings RN, MSN, BC-ADM Inpatient Diabetes Coordinator Team Pager [phone] (8a-5p)

## 2022-07-09 NOTE — H&P (Signed)
History and Physical    Patient: Charles Price [id number] DOB: Jun 02, 1976 DOA: 07/08/2022 DOS: the patient was seen and examined on 07/09/2022 PCP: Patient, No Pcp Per  Patient coming from: Home  Chief Complaint:  Chief Complaint  Patient presents with   Hyperglycemia   HPI: Charles Price is a 46 y.o. male with medical history significant of gout and tobacco use disorder, who does not follow with a PCP, presents to the ED with a chief complaint of blurry vision.  Patient reports that he has had polydipsia, polyuria, and blurry vision.  It started 10 days ago.  The first symptom noticed was polyuria.  He denies any dysuria or hematuria.  He denies polyphasia.  He admits to polydipsia, and is feeling that his mouth is always dry.  He also had not episode of nausea and vomiting 3 days ago.  It was nonbloody emesis.  Had 1 loose stool that was also nonbloody.  He reports loose stools due to excessive orange juice which probably further drove his DKA.  Patient has a family history of diabetes mellitus type 2 on his maternal side.  He was at [employer], when somebody who has diabetes checked his blood sugar and it was 600.  This is the reason he came into the ED today. Patient does smoke but declines nicotine patch.  He does drink 2-3 times per week.  He has never had DTs.  He does not use illicit drugs.  He is not vaccinated for COVID.  Patient is full code. Review of Systems: As mentioned in the history of present illness. All other systems reviewed and are negative. Past Medical History:  Diagnosis Date   Gout    History reviewed. No pertinent surgical history. Social History:  reports that he has been smoking cigarettes. He has been smoking an average of .5 packs per day. He has never used smokeless tobacco. He reports current alcohol use. He reports that he does not use drugs.  No Known Allergies  History reviewed. No pertinent family history.  Prior to Admission  medications   Medication Sig Start Date End Date Taking? Authorizing Provider  HYDROcodone-acetaminophen (NORCO/VICODIN) 5-325 MG tablet Take one tab po q 4 hrs prn pain 09/29/20   Triplett, Tammy, PA-C  indomethacin (INDOCIN) 25 MG capsule Take 1 capsule (25 mg total) by mouth 3 (three) times daily as needed. 09/02/20   Veryl Speak, MD    Physical Exam: Vitals:   07/09/22 0000 07/09/22 0030 07/09/22 0155  BP: (!) 161/105 (!) 133/93   Pulse: 84 88   Resp: 17 (!) 25   Temp: 98.5 F (36.9 C)    TempSrc: Oral    SpO2: 96% 95%   Weight:   136.1 kg  Height:   6\' 2"  (1.88 m)   1.  General: Patient lying supine in bed,  no acute distress   2. Psychiatric: Alert and oriented x 3, mood and behavior normal for situation, pleasant and cooperative with exam   3. Neurologic: Speech and language are normal, face is symmetric, moves all 4 extremities voluntarily, at baseline without acute deficits on limited exam   4. HEENMT:  Head is atraumatic, normocephalic, pupils reactive to light, neck is supple, trachea is midline, mucous membranes are moist   5. Respiratory : Lungs are clear to auscultation bilaterally without wheezing, rhonchi, rales, no cyanosis, no increase in work of breathing or accessory muscle use   6. Cardiovascular : Heart rate normal, rhythm is regular, no  murmurs, rubs or gallops, no peripheral edema, peripheral pulses palpated   7. Gastrointestinal:  Abdomen is soft, nondistended, nontender to palpation bowel sounds active, no masses or organomegaly palpated   8. Skin:  Skin is warm, dry and intact without rashes, acute lesions, or ulcers on limited exam   9.Musculoskeletal:  No acute deformities or trauma, no asymmetry in tone, no peripheral edema, peripheral pulses palpated, no tenderness to palpation in the extremities  Data Reviewed: In the ED Temp 98.5, heart rate 84-88, respiratory rate 17-25, blood pressure 133/93-161/105, satting 95% Leukocytosis with  white blood cell count of 7.4, hemoglobin 15.3 Chemistry reveals a pseudohyponatremia that corrects for glucose of 5.5D, hyperkalemia, elevated creatinine 0.60, decreased bicarb at 17, and elevated T. bili at 2.2 Beta hydroxybutyrate also elevated UA is not indicative of UTI Multiple fluid boluses given Admission requested for further management of DKA  Assessment and Plan: * DKA (diabetic ketoacidosis) (HCC) - pH 7.4, bicarb 17, gap 18, glucose 550 - Mild DKA - Endo tool initiated - Hemoglobin A1c in the a.m. currently n.p.o. - When patient is able to tolerate p.o., carb modified diet - Monitor CBGs on insulin drip  Tobacco use disorder - Patient declines nicotine patch at this time  Hyperkalemia - Potassium 5.2 - Glucose improved shift will lower potassium - Patient is also had multiple fluid boluses - Trend with every 4 hours BMP  AKI (acute kidney injury) (HCC) - Last creatinine 1.1, today 1.6 - Likely prerenal in the setting of DKA - DKA fluid bolus given in the ED as well 1 L normal saline bolus - IV maintenance fluid per Endo tool - Trend with every 4 hours BMP      Advance Care Planning:   Code Status: Not on file full  Consults: None  Family Communication: No family at bedside  Severity of Illness: The appropriate patient status for this patient is INPATIENT. Inpatient status is judged to be reasonable and necessary in order to provide the required intensity of service to ensure the patient's safety. The patient's presenting symptoms, physical exam findings, and initial radiographic and laboratory data in the context of their chronic comorbidities is felt to place them at high risk for further clinical deterioration. Furthermore, it is not anticipated that the patient will be medically stable for discharge from the hospital within 2 midnights of admission.   * I certify that at the point of admission it is my clinical judgment that the patient will require  inpatient hospital care spanning beyond 2 midnights from the point of admission due to high intensity of service, high risk for further deterioration and high frequency of surveillance required.*  Author: Lilyan Gilford, DO 07/09/2022 4:20 AM  For on call review [email or web address].

## 2022-07-09 NOTE — TOC Initial Note (Signed)
Transition of Care [facility]) - Initial/Assessment Note    Patient Details  Name: Charles Price MRN: [id number] Date of Birth: [birth year]  Transition of [facility]) CM/SW Contact:    Annice Needy, LCSW Phone Number: 07/09/2022, 4:30 PM  Clinical Narrative:                 Patient admitted for DKA. Consult for PCP needs. Insured. Lives in [place]. Providers accepting new patients in [place] added to patient's AVS.   Expected Discharge Plan: Home/Self Care Barriers to Discharge: Continued Medical Work up   Patient Goals and CMS Choice        Expected Discharge Plan and Services Expected Discharge Plan: Home/Self Care                                              Prior Living Arrangements/Services     Patient language and need for interpreter reviewed:: Yes        Need for Family Participation in Patient Care: No (Comment) Care giver support system in place?: Yes (comment)   Criminal Activity/Legal Involvement Pertinent to Current Situation/Hospitalization: No - Comment as needed  Activities of Daily Living Home Assistive Devices/Equipment: None ADL Screening (condition at time of admission) Patient's cognitive ability adequate to safely complete daily activities?: Yes Is the patient deaf or have difficulty hearing?: No Does the patient have difficulty seeing, even when wearing glasses/contacts?: No Does the patient have difficulty concentrating, remembering, or making decisions?: No Patient able to express need for assistance with ADLs?: No Does the patient have difficulty dressing or bathing?: No Independently performs ADLs?: Yes (appropriate for developmental age) Does the patient have difficulty walking or climbing stairs?: No Weakness of Legs: None Weakness of Arms/Hands: None  Permission Sought/Granted                  Emotional Assessment     Affect (typically observed): Appropriate Orientation: : Oriented to Self, Oriented to   Time, Oriented to Situation, Oriented to Place Alcohol / Substance Use: Not Applicable    Admission diagnosis:  Dehydration [E86.0] DKA (diabetic ketoacidosis) (HCC) [E11.10] AKI (acute kidney injury) (HCC) [N17.9] Diabetic ketoacidosis without coma associated with type 1 diabetes mellitus (HCC) [E10.10] Patient Active Problem List   Diagnosis Date Noted   DKA (diabetic ketoacidosis) (HCC) 07/09/2022   AKI (acute kidney injury) (HCC) 07/09/2022   Hyperkalemia 07/09/2022   Tobacco use disorder 07/09/2022   PCP:  Patient, No Pcp Per Pharmacy:   [id number] - [place], [place] - [address] [place] [address] Phone: [phone] Fax: [phone]  [id number] - [place], [place] - [address] [place] [address] Phone: [phone] Fax: [phone]     Social Determinants of Health (SDOH) Interventions    Readmission Risk Interventions     No data to display

## 2022-07-09 NOTE — Assessment & Plan Note (Signed)
-   Potassium 5.2 - Glucose improved shift will lower potassium - Patient is also had multiple fluid boluses - Trend with every 4 hours BMP

## 2022-07-09 NOTE — ED Provider Notes (Signed)
[facility] Provider Note   CSN: [id number] Arrival date & time: 07/08/22  2259     History  Chief Complaint  Patient presents with   Hyperglycemia    Charles Price is a 46 y.o. male.  The history is provided by the patient.  Hyperglycemia Associated symptoms: increased thirst, nausea, polyuria and vomiting   Associated symptoms: no fever    Patient reports starting about a week ago he began having polyuria, polydipsia He reports some blurred vision.  He reports generalized weakness.  He does report occasional episodes of nausea vomiting.  No chest pain or abdominal pain.  No previous history of diabetes.  He reports he was at a family dinner for Thanksgiving and he checked his glucose and it was over 600.     Home Medications Prior to Admission medications   Medication Sig Start Date End Date Taking? Authorizing Provider  HYDROcodone-acetaminophen (NORCO/VICODIN) 5-325 MG tablet Take one tab po q 4 hrs prn pain 09/29/20   Triplett, Tammy, PA-C  indomethacin (INDOCIN) 25 MG capsule Take 1 capsule (25 mg total) by mouth 3 (three) times daily as needed. 09/02/20   Veryl Speak, MD      Allergies    Patient has no known allergies.    Review of Systems   Review of Systems  Constitutional:  Negative for fever.  Gastrointestinal:  Positive for nausea and vomiting.  Endocrine: Positive for polydipsia and polyuria.    Physical Exam Updated Vital Signs BP (!) 133/93   Pulse 88   Temp 98.5 F (36.9 C) (Oral)   Resp (!) 25   Ht 1.88 m (6\' 2" )   Wt 136.1 kg   SpO2 95%   BMI 38.52 kg/m  Physical Exam CONSTITUTIONAL: Well developed/well nourished, smells of ketones HEAD: Normocephalic/atraumatic EYES: EOMI/PERRL ENMT: Mucous membranes dry NECK: supple no meningeal signs SPINE/BACK:entire spine nontender CV: S1/S2 noted, no murmurs/rubs/gallops noted LUNGS: Lungs are clear to auscultation bilaterally, no apparent distress ABDOMEN: soft,  nontender NEURO: Pt is awake/alert/appropriate, moves all extremitiesx4.  No facial droop.   EXTREMITIES: pulses normal/equal, full ROM SKIN: warm, color normal PSYCH: no abnormalities of mood noted, alert and oriented to situation  ED Results / Procedures / Treatments   Labs (all labs ordered are listed, but only abnormal results are displayed) Labs Reviewed  CBC - Abnormal; Notable for the following components:      Result Value   MCV 78.7 (*)    MCHC 37.0 (*)    All other components within normal limits  COMPREHENSIVE METABOLIC PANEL - Abnormal; Notable for the following components:   Sodium 130 (*)    Potassium 5.2 (*)    Chloride 95 (*)    CO2 17 (*)    Glucose, Bld 550 (*)    BUN 26 (*)    Creatinine, Ser 1.60 (*)    Alkaline Phosphatase 171 (*)    Total Bilirubin 2.2 (*)    GFR, Estimated 54 (*)    Anion gap 18 (*)    All other components within normal limits  BETA-HYDROXYBUTYRIC ACID - Abnormal; Notable for the following components:   Beta-Hydroxybutyric Acid 3.81 (*)    All other components within normal limits  URINALYSIS, ROUTINE W REFLEX MICROSCOPIC - Abnormal; Notable for the following components:   Color, Urine STRAW (*)    Glucose, UA >=500 (*)    Hgb urine dipstick SMALL (*)    Ketones, ur 20 (*)    All other components within  normal limits  BLOOD GAS, VENOUS - Abnormal; Notable for the following components:   pCO2, Ven 37 (*)    pO2, Ven 49 (*)    All other components within normal limits  CBG MONITORING, ED - Abnormal; Notable for the following components:   Glucose-Capillary 538 (*)    All other components within normal limits  CBG MONITORING, ED - Abnormal; Notable for the following components:   Glucose-Capillary 440 (*)    All other components within normal limits  CBG MONITORING, ED - Abnormal; Notable for the following components:   Glucose-Capillary 392 (*)    All other components within normal limits  BETA-HYDROXYBUTYRIC ACID   BETA-HYDROXYBUTYRIC ACID    EKG EKG Interpretation  Date/Time:  Friday July 09 2022 00:14:32 EST Ventricular Rate:  88 PR Interval:  171 QRS Duration: 91 QT Interval:  383 QTC Calculation: 464 R Axis:   26 Text Interpretation: Sinus rhythm Probable left atrial enlargement Confirmed by Zadie Rhine ([id number]) on 07/09/2022 12:43:27 AM  Radiology No results found.  Procedures .Critical Care  Performed by: Zadie Rhine, MD Authorized by: Zadie Rhine, MD   Critical care provider statement:    Critical care time (minutes):  60   Critical care start time:  07/09/2022 12:00 AM   Critical care end time:  07/09/2022 1:00 AM   Critical care time was exclusive of:  Separately billable procedures and treating other patients   Critical care was necessary to treat or prevent imminent or life-threatening deterioration of the following conditions:  Endocrine crisis, metabolic crisis and dehydration   Critical care was time spent personally by me on the following activities:  Examination of patient, development of treatment plan with patient or surrogate, obtaining history from patient or surrogate, re-evaluation of patient's condition, ordering and review of laboratory studies and ordering and performing treatments and interventions   I assumed direction of critical care for this patient from another provider in my specialty: no     Care discussed with: admitting provider       Medications Ordered in ED Medications  lactated ringers bolus 20 mL/kg (has no administration in time range)  insulin regular, human (MYXREDLIN) 100 units/ 100 mL infusion (15 Units/hr Intravenous New Bag/Given 07/09/22 0208)  lactated ringers infusion ( Intravenous New Bag/Given 07/09/22 0213)  dextrose 5 % in lactated ringers infusion (has no administration in time range)  dextrose 50 % solution 0-50 mL (has no administration in time range)  sodium chloride 0.9 % bolus 1,000 mL (0 mLs Intravenous  Stopped 07/09/22 0155)    ED Course/ Medical Decision Making/ A&P Clinical Course as of 07/09/22 0337  Fri Jul 09, 2022  0125 Glucose(!!): 550 hyperglycemia [DW]  0125 Creatinine(!): 1.60 Acute kidney injury  [DW]  0334 Patient presents with new onset diabetic ketoacidosis.  He is not vomiting.  He will need to be admitted for IV rehydration and insulin.  Discussed with Dr. Dorthula Perfect for admission [DW]    Clinical Course User Index [DW] Zadie Rhine, MD                           Medical Decision Making Amount and/or Complexity of Data Reviewed Labs: ordered. Decision-making details documented in ED Course. ECG/medicine tests: ordered.  Risk Prescription drug management. Decision regarding hospitalization.     This patient presents to the ED for concern of weakness, vomiting, this involves an extensive number of treatment options, and is a complaint that carries with  it a high risk of complications and morbidity.  The differential diagnosis includes but is not limited to CVA, intracranial hemorrhage, acute coronary syndrome, renal failure, urinary tract infection, electrolyte disturbance, pneumonia    Comorbidities that complicate the patient evaluation: No known medical conditions  Social Determinants of Health: Patient's impaired access to primary care  increases the complexity of managing their presentation  Lab Tests: I Ordered, and personally interpreted labs.  The pertinent results include: Labs consistent with diabetic ketoacidosis, acute kidney injury  Cardiac Monitoring: The patient was maintained on a cardiac monitor.  I personally viewed and interpreted the cardiac monitor which showed an underlying rhythm of:  sinus rhythm  Medicines ordered and prescription drug management: I ordered medication including IV fluids and insulin for DKA Reevaluation of the patient after these medicines showed that the patient    improved  Critical Interventions:   IV fluids  and insulin  Consultations Obtained: I requested consultation with the admitting physician [facility] , and discussed  findings as well as pertinent plan - they recommend: Admit  Reevaluation: After the interventions noted above, I reevaluated the patient and found that they have :improved  Complexity of problems addressed: Patient's presentation is most consistent with  acute presentation with potential threat to life or bodily function  Disposition: After consideration of the diagnostic results and the patient's response to treatment,  I feel that the patent would benefit from admission   .          Final Clinical Impression(s) / ED Diagnoses Final diagnoses:  Diabetic ketoacidosis without coma associated with type 1 diabetes mellitus ([place])  AKI (acute kidney injury) ([place])  Dehydration    Rx / DC Orders ED Discharge Orders     None         Ripley Fraise, MD 07/09/22 [phone]

## 2022-07-09 NOTE — Progress Notes (Signed)
  RD consulted for nutrition education regarding diabetes.   Presents with hyperglycemia (blood glucose 600 Thanksgiving day). Improving.  CBG (last 3)  Recent Labs    07/09/22 0956 07/09/22 1053 07/09/22 1154  GLUCAP 198* 186* 188*     No results found for: "HGBA1C"  RD provided "General Guidelines for People with Diabetes"  and "Diabetes Plate Method" handouts. Discussed different food groups and their effects on blood sugar, emphasizing carbohydrate-containing foods. Provided list of carbohydrates and recommended serving sizes of common foods. Recommended he visit American Diabetes Association website and watch diabetes related videos while in hospital.   Discussed importance of controlled and consistent carbohydrate intake throughout the day. Provided examples of ways to balance meals/snacks and encouraged intake of high-fiber, whole grain complex carbohydrates. Teach back method used.  Expect good compliance. Patient express readiness to make lifestyle changes.  Body mass index is 38.52 kg/m. Pt meets criteria for obesity based on current BMI.  Current diet order is CHO modified, patient diet recently advanced and he has not received a meal at this time.   Labs and medications reviewed. No further nutrition interventions warranted at this time. RD contact information provided. If additional nutrition issues arise, please re-consult RD.  Royann Shivers MS,RD,CSG,LDN Contact: Loretha Stapler

## 2022-07-09 NOTE — Discharge Summary (Signed)
Physician Discharge Summary  Charles Price [id number] DOB: 1975/09/12 DOA: 07/08/2022  PCP: Patient, No Pcp Per  Admit date: 07/08/2022 Discharge date: 07/09/2022  Admitted From: Home Disposition: Home  Recommendations for Outpatient Follow-up:  Follow up with PCP in 1-2 weeks Please obtain BMP/CBC in one week   Discharge Condition: Stable CODE STATUS: Full code Diet recommendation: Carb modified  Brief/Interim Summary: 47 year old male admitted to the hospital with diabetic ketoacidosis.  He was found to have new onset diabetes mellitus.  He has associated acute kidney injury, hyperkalemia secondary to volume depletion from DKA.  The patient was treated with intravenous fluids and IV insulin.  Overall anion gap closed and blood sugars improved.  Renal function normalized.  He was started on a diet and did not have any recurrent nausea or vomiting.  He underwent education by dietitian as well as diabetes coordinator.  He received teaching on how to check his blood sugars and administering insulin.  He has been started on once a day Lantus.  He plans to follow-up with primary care physician.  Patient's blood sugars have otherwise remained stable.  He is otherwise stable for discharge.  Discharge Diagnoses:  Principal Problem:   DKA (diabetic ketoacidosis) ([place]) Active Problems:   AKI (acute kidney injury) ([place])   Hyperkalemia   Tobacco use disorder    Discharge Instructions  Discharge Instructions     Diet - low sodium heart healthy   Complete by: As directed    Increase activity slowly   Complete by: As directed       Allergies as of 07/09/2022   No Known Allergies      Medication List     STOP taking these medications    HYDROcodone-acetaminophen 5-325 MG tablet Commonly known as: NORCO/VICODIN   indomethacin 25 MG capsule Commonly known as: INDOCIN       TAKE these medications    blood glucose meter kit and supplies Dispense based on patient and  insurance preference. Use up to four times daily as directed. (FOR ICD-10 E10.9, E11.9).   insulin detemir 100 UNIT/ML FlexPen Commonly known as: LEVEMIR Inject 45 Units into the skin daily.   Insulin Pen Needle 32G X 4 MM Misc Use as directed        Follow-up Information     Primary Care Physician. Schedule an appointment as soon as possible for a visit in 3 day(s).   Why: Providers Accepting New Patients  [facility] [address] [place], [place] [address] [place] [address] [place], [place] [address] [phone] Contact information: Please contact providers listed to schedule a primary care physician appointment with the practice of your choice.               No Known Allergies  Consultations:    Procedures/Studies: No results found.    Subjective: He reports feeling well.  Nausea vomiting resolved.  Tolerating diet.  Discharge Exam: Vitals:   07/09/22 1500 07/09/22 1600 07/09/22 1700 07/09/22 1800  BP: (!) 145/97 (!) 156/94    Pulse: 70 75 84 85  Resp: _0 Temp:      TempSrc:      SpO2: 98% 98% 96% 97%  Weight:      Height:        General: Pt is alert, awake, not in acute distress Cardiovascular: RRR, S1/S2 +, no rubs, no gallops Respiratory: CTA bilaterally, no wheezing, no rhonchi Abdominal: Soft,  NT, ND, bowel sounds + Extremities: no edema, no cyanosis    The results of significant diagnostics from this hospitalization (including imaging, microbiology, ancillary and laboratory) are listed below for reference.     Microbiology: Recent Results (from the past 240 hour(s))  MRSA Next Gen by PCR, Nasal     Status: None   Collection Time: 07/09/22  9:14 AM   Specimen: Nasal Mucosa; Nasal Swab  Result Value Ref Range Status   MRSA by PCR Next Gen NOT DETECTED NOT DETECTED Final    Comment: (NOTE) The GeneXpert MRSA Assay (FDA  approved for NASAL specimens only), is one component of a comprehensive MRSA colonization surveillance program. It is not intended to diagnose MRSA infection nor to guide or monitor treatment for MRSA infections. Test performance is not FDA approved in patients less than 70 years old. Performed at [facility], [address], [place], [place] [address]      Labs: BNP (last 3 results) No results for input(s): "BNP" in the last 8760 hours. Basic Metabolic Panel: Recent Labs  Lab 07/09/22 0025 07/09/22 0526 07/09/22 0842 07/09/22 1220  NA 130* 133* 134* 136  K 5.2* 3.9 3.7 3.8  CL 95* 100 101 102  CO2 17* 21* 22 23  GLUCOSE 550* 205* 192* 175*  BUN 26* _0 CREATININE 1.60* 0.89 0.79 0.83  CALCIUM 9.5 9.1 8.8* 9.1  MG  --  3.1*  --   --    Liver Function Tests: Recent Labs  Lab 07/09/22 0025  AST 36  ALT 31  ALKPHOS 171*  BILITOT 2.2*  PROT 7.8  ALBUMIN 3.9   No results for input(s): "LIPASE", "AMYLASE" in the last 168 hours. No results for input(s): "AMMONIA" in the last 168 hours. CBC: Recent Labs  Lab 07/09/22 0025 07/09/22 0526  WBC 7.4 6.9  NEUTROABS  --  3.4  HGB 15.3 14.5  HCT 41.3 39.2  MCV 78.7* 79.8*  PLT 215 180   Cardiac Enzymes: No results for input(s): "CKTOTAL", "CKMB", "CKMBINDEX", "TROPONINI" in the last 168 hours. BNP: Invalid input(s): "POCBNP" CBG: Recent Labs  Lab 07/09/22 1053 07/09/22 1154 07/09/22 1309 07/09/22 1403 07/09/22 1645  GLUCAP 186* 188* 143* 139* 221*   D-Dimer No results for input(s): "DDIMER" in the last 72 hours. Hgb A1c No results for input(s): "HGBA1C" in the last 72 hours. Lipid Profile No results for input(s): "CHOL", "HDL", "LDLCALC", "TRIG", "CHOLHDL", "LDLDIRECT" in the last 72 hours. Thyroid function studies Recent Labs    07/09/22 0526  TSH 3.522   Anemia work up No results for input(s): "VITAMINB12", "FOLATE", "FERRITIN", "TIBC", "IRON", "RETICCTPCT" in the last 72 hours. Urinalysis     Component Value Date/Time   COLORURINE STRAW (A) 07/09/2022 0043   APPEARANCEUR CLEAR 07/09/2022 0043   LABSPEC 1.028 07/09/2022 0043   PHURINE 5.0 07/09/2022 0043   GLUCOSEU >=500 (A) 07/09/2022 0043   HGBUR SMALL (A) 07/09/2022 0043   BILIRUBINUR NEGATIVE 07/09/2022 0043   KETONESUR 20 (A) 07/09/2022 0043   PROTEINUR NEGATIVE 07/09/2022 0043   UROBILINOGEN 2.0 (H) 06/20/2008 0922   NITRITE NEGATIVE 07/09/2022 0043   LEUKOCYTESUR NEGATIVE 07/09/2022 0043   Sepsis Labs Recent Labs  Lab 07/09/22 0025 07/09/22 0526  WBC 7.4 6.9   Microbiology Recent Results (from the past 240 hour(s))  MRSA Next Gen by PCR, Nasal     Status: None   Collection Time: 07/09/22  9:14 AM   Specimen: Nasal Mucosa; Nasal Swab  Result Value Ref  Range Status   MRSA by PCR Next Gen NOT DETECTED NOT DETECTED Final    Comment: (NOTE) The GeneXpert MRSA Assay (FDA approved for NASAL specimens only), is one component of a comprehensive MRSA colonization surveillance program. It is not intended to diagnose MRSA infection nor to guide or monitor treatment for MRSA infections. Test performance is not FDA approved in patients less than 51 years old. Performed at [facility] [address] [place], [place] [address]      Time coordinating discharge: 46mns  SIGNED:   JKathie Dike MD  [facility] 07/09/2022, 8:14 PM   If 7PM-7AM, please contact night-coverage [email or web address]

## 2022-07-09 NOTE — Assessment & Plan Note (Signed)
-   pH 7.4, bicarb 17, gap 18, glucose 550 - Mild DKA - Endo tool initiated - Hemoglobin A1c in the a.m. currently n.p.o. - When patient is able to tolerate p.o., carb modified diet - Monitor CBGs on insulin drip

## 2022-07-09 NOTE — Assessment & Plan Note (Signed)
-   Patient declines nicotine patch at this time

## 2022-07-09 NOTE — Assessment & Plan Note (Signed)
-   Last creatinine 1.1, today 1.6 - Likely prerenal in the setting of DKA - DKA fluid bolus given in the ED as well 1 L normal saline bolus - IV maintenance fluid per Endo tool - Trend with every 4 hours BMP

## 2022-07-09 NOTE — ED Triage Notes (Signed)
Pt arrived from home via POV w c/o hyperglycemia. Pt does not have dx of diabetes, but has not been able to quench his thirst and has noted blurry vision. His sister checked his sugar with her machine and it said over 600.

## 2022-07-09 NOTE — ED Notes (Signed)
Date and time results received: 07/09/22 0123 (use smartphrase ".now" to insert current time)  Test: glucose Critical Value: 550  Name of Provider Notified: D. Bebe Shaggy, MD

## 2022-07-10 LAB — HEMOGLOBIN A1C
Hgb A1c MFr Bld: 12.7 % — ABNORMAL HIGH (ref 4.8–5.6)
Mean Plasma Glucose: 318 mg/dL

## 2022-07-28 ENCOUNTER — Encounter (HOSPITAL_COMMUNITY): Payer: Self-pay

## 2022-07-28 ENCOUNTER — Emergency Department (HOSPITAL_COMMUNITY)
Admission: EM | Admit: 2022-07-28 | Discharge: 2022-07-28 | Disposition: A | Discharge status: Home / Self Care | Payer: Managed Care, Other (non HMO) | Attending: Student | Admitting: Student

## 2022-07-28 DIAGNOSIS — E1165 Type 2 diabetes mellitus with hyperglycemia: Secondary | ICD-10-CM | POA: Diagnosis not present

## 2022-07-28 DIAGNOSIS — Z794 Long term (current) use of insulin: Secondary | ICD-10-CM | POA: Insufficient documentation

## 2022-07-28 DIAGNOSIS — Z760 Encounter for issue of repeat prescription: Secondary | ICD-10-CM | POA: Diagnosis not present

## 2022-07-28 MED ORDER — GLUCOSE BLOOD VI STRP: ORAL_STRIP | 12 refills | Status: AC

## 2022-07-28 NOTE — ED Provider Notes (Signed)
  [facility] DEPT Provider Note   CSN: [id number] Arrival date & time: 07/28/22  1233     History  Chief Complaint  Patient presents with   Medication Refill    Charles Price is a 46 y.o. male.  50-year-old male with recent diagnosis of type 2 diabetes on 45 units of insulin at night presents to the ER complaining of being out of his test strips.  Checking his blood sugar and has lost, blood sugars have been well-controlled between 90 and 150s.  He has no complaints but states he does not want to be unable to check his sugars.  Also requesting referral to primary care doctor as he has not followed up with one yet.   Medication Refill      Home Medications Prior to Admission medications   Medication Sig Start Date End Date Taking? Authorizing Provider  glucose blood test strip Use as instructed 07/28/22  Yes Horst Ostermiller A, PA-C  blood glucose meter kit and supplies Dispense based on patient and insurance preference. Use up to four times daily as directed. (FOR ICD-10 E10.9, E11.9). 07/09/22   Kathie Dike, MD  insulin detemir (LEVEMIR) 100 UNIT/ML FlexPen Inject 45 Units into the skin daily. 07/09/22   Kathie Dike, MD  Insulin Pen Needle 32G X 4 MM MISC Use as directed 07/09/22   Kathie Dike, MD      Allergies    Patient has no known allergies.    Review of Systems   Review of Systems  Physical Exam Updated Vital Signs BP (!) 146/81 (BP Location: Right Arm)   Pulse 85   Temp 98.3 F (36.8 C) (Oral)   Resp 18   SpO2 95%  Physical Exam  ED Results / Procedures / Treatments   Labs (all labs ordered are listed, but only abnormal results are displayed) Labs Reviewed - No data to display  EKG None  Radiology No results found.  Procedures Procedures    Medications Ordered in ED Medications - No data to display  ED Course/ Medical Decision Making/ A&P                           Medical Decision Making Here  wanting refill of his glucometer strips.  This was done he has no complaints, vitals are reassuring, informed follow-up with PCP for blood pressure control and recheck blood pressure as it was likely elevated today.  Advised to come back if he has any new or worsening symptoms, blood sugars been well-controlled.           Final Clinical Impression(s) / ED Diagnoses Final diagnoses:  Medication refill    Rx / DC Orders ED Discharge Orders          Ordered    glucose blood test strip        07/28/22 1303              Gwenevere Abbot, PA-C 07/28/22 1307    Teressa Lower, MD 07/28/22 2144

## 2022-07-28 NOTE — ED Triage Notes (Signed)
Pt needs prescription filled for glucose strips.

## 2022-07-28 NOTE — Discharge Instructions (Signed)
Do testing her blood sugar, call the primary care doctor office today or tomorrow for close follow-up to manage her diabetes and recheck your blood pressure which was slightly elevated today.  Come back to the ER for any new or worsening symptoms.

## 2022-10-17 ENCOUNTER — Encounter (HOSPITAL_COMMUNITY): Payer: Self-pay

## 2022-10-17 ENCOUNTER — Emergency Department (HOSPITAL_COMMUNITY)
Admission: EM | Admit: 2022-10-17 | Discharge: 2022-10-18 | Disposition: A | Discharge status: Home / Self Care | Payer: Managed Care, Other (non HMO) | Attending: Emergency Medicine | Admitting: Emergency Medicine

## 2022-10-17 ENCOUNTER — Emergency Department (HOSPITAL_COMMUNITY): Payer: Managed Care, Other (non HMO)

## 2022-10-17 ENCOUNTER — Other Ambulatory Visit: Payer: Self-pay

## 2022-10-17 DIAGNOSIS — Z794 Long term (current) use of insulin: Secondary | ICD-10-CM | POA: Insufficient documentation

## 2022-10-17 DIAGNOSIS — R1310 Dysphagia, unspecified: Secondary | ICD-10-CM

## 2022-10-17 MED ORDER — LIDOCAINE VISCOUS HCL 2 % MT SOLN
15.0000 mL | Freq: Once | OROMUCOSAL | Status: AC
  Administered 2022-10-17: 15 mL via ORAL
  Filled 2022-10-17: qty 15

## 2022-10-17 MED ORDER — ALUM & MAG HYDROXIDE-SIMETH 200-200-20 MG/5ML PO SUSP
30.0000 mL | Freq: Once | ORAL | Status: AC
  Administered 2022-10-17: 30 mL via ORAL
  Filled 2022-10-17: qty 30

## 2022-10-17 NOTE — ED Triage Notes (Signed)
States that he was eating spaghetti and states he has something stuck in his throat.

## 2022-10-18 MED ORDER — FAMOTIDINE 20 MG PO TABS: 20.0000 mg | ORAL_TABLET | Freq: Two times a day (BID) | ORAL | 0 refills | Status: DC

## 2022-10-18 MED ORDER — LIDOCAINE VISCOUS HCL 2 % MT SOLN: 15.0000 mL | OROMUCOSAL | 0 refills | Status: DC | PRN

## 2022-10-18 MED ORDER — LIDOCAINE VISCOUS HCL 2 % MT SOLN: 15.0000 mL | OROMUCOSAL | 0 refills | Status: AC | PRN

## 2022-10-18 MED ORDER — FAMOTIDINE 20 MG PO TABS: 20.0000 mg | ORAL_TABLET | Freq: Two times a day (BID) | ORAL | 0 refills | Status: AC

## 2022-10-18 NOTE — ED Provider Notes (Signed)
[place] [facility] AT [facility] Provider Note   CSN: [id number] Arrival date & time: 10/17/22  2142     History  Chief Complaint  Patient presents with   Dysphagia    Charles Price is a 47 y.o. male.  HPI  Patient is a 47 year old male with past medical history significant for DKA, AKI, hyperkalemia presents emergency room today with complaints of uncomfortable sensation when he swallows.  He states that he ate some Spaete for dinner and states that since that time he feels like he has had something stuck in his throat.  Denies any choking or gagging.  He denies any difficulty breathing he is swallowing that difficulty he has not had to drool has not had any changes in phonation he denies any sore throat. No other associated symptoms.  He has never had an endoscopy done in the past.  He states that he has had a couple episodes where he has had similar symptoms in the past.     Home Medications Prior to Admission medications   Medication Sig Start Date End Date Taking? Authorizing Provider  blood glucose meter kit and supplies Dispense based on patient and insurance preference. Use up to four times daily as directed. (FOR ICD-10 E10.9, E11.9). 07/09/22   Kathie Dike, MD  famotidine (PEPCID) 20 MG tablet Take 1 tablet (20 mg total) by mouth 2 (two) times daily. 10/18/22   Tedd Sias, PA  glucose blood test strip Use as instructed 07/28/22   Sherrye Payor A, PA-C  insulin detemir (LEVEMIR) 100 UNIT/ML FlexPen Inject 45 Units into the skin daily. 07/09/22   Kathie Dike, MD  Insulin Pen Needle 32G X 4 MM MISC Use as directed 07/09/22   Kathie Dike, MD  lidocaine (XYLOCAINE) 2 % solution Use as directed 15 mLs in the mouth or throat every 4 (four) hours as needed for mouth pain. 10/18/22   Tedd Sias, PA      Allergies    Patient has no known allergies.    Review of Systems   Review of Systems  Physical Exam Updated Vital Signs BP  (!) 155/99   Pulse 63   Temp 98 F (36.7 C)   Resp 18   Ht '6\' 2"'$  (1.88 m)   Wt 117.9 kg   SpO2 99%   BMI 33.38 kg/m  Physical Exam Vitals and nursing note reviewed.  Constitutional:      General: He is not in acute distress.    Comments: Pleasant well-appearing 47 year old.  In no acute distress.  Sitting comfortably in bed.  Able answer questions appropriately follow commands. No increased work of breathing. Speaking in full sentences.   HENT:     Head: Normocephalic and atraumatic.     Nose: Nose normal.     Mouth/Throat:     Mouth: Mucous membranes are moist.     Comments: Posterior pharynx with erythema, swelling, tonsillar hypertrophy or exudates.  He is managing secretions without difficulty speaking with normal phonation Eyes:     General: No scleral icterus. Cardiovascular:     Rate and Rhythm: Normal rate and regular rhythm.     Pulses: Normal pulses.     Heart sounds: Normal heart sounds.  Pulmonary:     Effort: Pulmonary effort is normal. No respiratory distress.     Breath sounds: No wheezing.  Abdominal:     Palpations: Abdomen is soft.     Tenderness: There is no abdominal tenderness.  Musculoskeletal:  Cervical back: Normal range of motion.     Right lower leg: No edema.     Left lower leg: No edema.  Skin:    General: Skin is warm and dry.     Capillary Refill: Capillary refill takes less than 2 seconds.  Neurological:     Mental Status: He is alert. Mental status is at baseline.  Psychiatric:        Mood and Affect: Mood normal.        Behavior: Behavior normal.     ED Results / Procedures / Treatments   Labs (all labs ordered are listed, but only abnormal results are displayed) Labs Reviewed - No data to display  EKG None  Radiology DG Chest 2 View  Result Date: 10/17/2022 CLINICAL DATA:  Cough EXAM: CHEST - 2 VIEW COMPARISON:  None Available. FINDINGS: The heart size and mediastinal contours are within normal limits. Both lungs are  clear. The visualized skeletal structures are unremarkable. No radiopaque foreign body. IMPRESSION: No active cardiopulmonary disease. Electronically Signed   By: Rolm Baptise M.D.   On: 10/17/2022 23:14    Procedures Procedures    Medications Ordered in ED Medications  alum & mag hydroxide-simeth (MAALOX/MYLANTA) 200-200-20 MG/5ML suspension 30 mL (30 mLs Oral Given 10/17/22 2240)    And  lidocaine (XYLOCAINE) 2 % viscous mouth solution 15 mL (15 mLs Oral Given 10/17/22 2241)    ED Course/ Medical Decision Making/ A&P                             Medical Decision Making Amount and/or Complexity of Data Reviewed Radiology: ordered.  Risk OTC drugs. Prescription drug management.   Patient is a 47 year old male with past medical history significant for DKA, AKI, hyperkalemia presents emergency room today with complaints of uncomfortable sensation when he swallows.  He states that he ate some Spaete for dinner and states that since that time he feels like he has had something stuck in his throat.  Denies any choking or gagging.  He denies any difficulty breathing he is swallowing that difficulty he has not had to drool has not had any changes in phonation he denies any sore throat. No other associated symptoms.  He has never had an endoscopy done in the past.  He states that he has had a couple episodes where he has had similar symptoms in the past.   Chest x-ray unremarkable.  Physical exam without any abnormal findings.  Managing secretions talking and ambulatory tolerating p.o. well-appearing overall.  Patient will need to follow-up with gastroenterology.  Pepcid and lidocaine provided to patient to use at home.  He feels improved after 1 dose of GI cocktail here.  No chest pain difficulty breathing.  Suspect esophageal stricture, web or other.   Final Clinical Impression(s) / ED Diagnoses Final diagnoses:  Odynophagia    Rx / DC Orders ED Discharge Orders          Ordered     famotidine (PEPCID) 20 MG tablet  2 times daily,   Status:  Discontinued        10/18/22 0017    lidocaine (XYLOCAINE) 2 % solution  Every 4 hours PRN,   Status:  Discontinued        10/18/22 0017    famotidine (PEPCID) 20 MG tablet  2 times daily        10/18/22 0100    lidocaine (XYLOCAINE) 2 % solution  Every 4 hours PRN        10/18/22 0100              Tedd Sias, [place] 10/18/22 0103    Quintella Reichert, MD 10/18/22 [phone]

## 2022-10-18 NOTE — Discharge Instructions (Signed)
Please follow up at one of the two gastroenterology offices I have given you the information for.  I recommend taking the Pepcid I have prescribed you and use viscous lidocaine as prescribed as needed.  Cut up food very small and pured food if needed.  Return to emergency room for any new or worsening symptoms as we discussed and difficulty swallowing to the extent of having to drool or any difficulty breathing.
